# Patient Record
Sex: Female | Born: 1935 | Race: White | Hispanic: No | Marital: Single | Smoking: Never smoker
Health system: Southern US, Community
[De-identification: ages and names within clinical notes are randomized; demographics above are authoritative.]

## PROBLEM LIST (undated history)

## (undated) DIAGNOSIS — F0390 Unspecified dementia without behavioral disturbance: Secondary | ICD-10-CM

## (undated) DIAGNOSIS — E079 Disorder of thyroid, unspecified: Secondary | ICD-10-CM

## (undated) DIAGNOSIS — I10 Essential (primary) hypertension: Secondary | ICD-10-CM

## (undated) HISTORY — PX: HERNIA REPAIR: SHX51

---

## 2011-05-22 ENCOUNTER — Emergency Department (HOSPITAL_COMMUNITY): Payer: Medicare Other

## 2011-05-22 ENCOUNTER — Emergency Department (HOSPITAL_COMMUNITY)
Admission: EM | Admit: 2011-05-22 | Discharge: 2011-05-22 | Disposition: A | Discharge status: Home / Self Care | Payer: Medicare Other | Attending: Emergency Medicine | Admitting: Emergency Medicine

## 2011-05-22 ENCOUNTER — Encounter (HOSPITAL_COMMUNITY): Payer: Self-pay | Admitting: Cardiology

## 2011-05-22 DIAGNOSIS — Z79899 Other long term (current) drug therapy: Secondary | ICD-10-CM | POA: Insufficient documentation

## 2011-05-22 DIAGNOSIS — R079 Chest pain, unspecified: Secondary | ICD-10-CM | POA: Insufficient documentation

## 2011-05-22 DIAGNOSIS — E039 Hypothyroidism, unspecified: Secondary | ICD-10-CM | POA: Insufficient documentation

## 2011-05-22 DIAGNOSIS — I10 Essential (primary) hypertension: Secondary | ICD-10-CM | POA: Insufficient documentation

## 2011-05-22 DIAGNOSIS — F068 Other specified mental disorders due to known physiological condition: Secondary | ICD-10-CM | POA: Insufficient documentation

## 2011-05-22 DIAGNOSIS — K5900 Constipation, unspecified: Secondary | ICD-10-CM | POA: Insufficient documentation

## 2011-05-22 HISTORY — DX: Essential (primary) hypertension: I10

## 2011-05-22 HISTORY — DX: Disorder of thyroid, unspecified: E07.9

## 2011-05-22 HISTORY — DX: Unspecified dementia, unspecified severity, without behavioral disturbance, psychotic disturbance, mood disturbance, and anxiety: F03.90

## 2011-05-22 LAB — URINALYSIS, ROUTINE W REFLEX MICROSCOPIC
Bilirubin Urine: NEGATIVE
Glucose, UA: NEGATIVE mg/dL
Hgb urine dipstick: NEGATIVE
Ketones, ur: NEGATIVE mg/dL
Protein, ur: NEGATIVE mg/dL
pH: 6.5 (ref 5.0–8.0)

## 2011-05-22 LAB — COMPREHENSIVE METABOLIC PANEL
ALT: 12 U/L (ref 0–35)
AST: 17 U/L (ref 0–37)
Albumin: 3.6 g/dL (ref 3.5–5.2)
Alkaline Phosphatase: 84 U/L (ref 39–117)
CO2: 26 mEq/L (ref 19–32)
Chloride: 101 mEq/L (ref 96–112)
GFR calc non Af Amer: 31 mL/min — ABNORMAL LOW (ref >90)
Potassium: 3.7 mEq/L (ref 3.5–5.1)
Total Bilirubin: 0.3 mg/dL (ref 0.3–1.2)

## 2011-05-22 LAB — URINE MICROSCOPIC-ADD ON

## 2011-05-22 LAB — DIFFERENTIAL
Basophils Absolute: 0 10*3/uL (ref 0.0–0.1)
Basophils Relative: 0 % (ref 0–1)
Lymphocytes Relative: 36 % (ref 12–46)
Monocytes Absolute: 0.8 10*3/uL (ref 0.1–1.0)
Monocytes Relative: 9 % (ref 3–12)
Neutro Abs: 4.3 10*3/uL (ref 1.7–7.7)
Neutrophils Relative %: 50 % (ref 43–77)

## 2011-05-22 LAB — CBC
HCT: 39.4 % (ref 36.0–46.0)
Hemoglobin: 13.5 g/dL (ref 12.0–15.0)
MCHC: 34.3 g/dL (ref 30.0–36.0)
RDW: 13.3 % (ref 11.5–15.5)
WBC: 8.6 10*3/uL (ref 4.0–10.5)

## 2011-05-22 NOTE — ED Notes (Signed)
Pt to department via EMS from [facility]- reports that when she woke up this morning that she had some lower back pain that turned into epigastric pain. Staff there reports that they wanted the pt checked out for cardiac work up. Bp- 124/78 Hr-60.

## 2011-05-22 NOTE — Discharge Instructions (Signed)
Increase the prilosec to two times a day.  Have her provider check her this week.

## 2011-05-22 NOTE — ED Notes (Signed)
Report was called to [facility], waiting for [facility] to transport patient back to the [facility]

## 2011-05-24 NOTE — ED Provider Notes (Cosign Needed)
History     CSN: [id number]  Arrival date & time 05/22/11  1246   First MD Initiated Contact with Patient 05/22/11 1305      Chief Complaint  Patient presents with  . Abdominal Pain    (Consider location/radiation/quality/duration/timing/severity/associated sxs/prior treatment) Patient is a 78 y.o. female presenting with constipation. The history is provided by the patient. No language interpreter was used.  Constipation  The current episode started yesterday. The problem occurs frequently. The problem has been unchanged. The pain is mild. The stool is described as hard. There was no prior successful therapy. There was no prior unsuccessful therapy. Pertinent negatives include no abdominal pain, no diarrhea, no hematuria, no headaches, no coughing and no rash.    Past Medical History  Diagnosis Date  . Hypertension   . Dementia   . Thyroid disease     Hypothyroidism  . Constipation     Past Surgical History  Procedure Date  . Hernia repair     History reviewed. No pertinent family history.  History  Substance Use Topics  . Smoking status: Not on file  . Smokeless tobacco: Not on file  . Alcohol Use:     OB History    Grav Para Term Preterm Abortions TAB SAB Ect Mult Living                  Review of Systems  Constitutional: Negative for fatigue.  HENT: Negative for congestion, sinus pressure and ear discharge.   Eyes: Negative for discharge.  Respiratory: Negative for cough.   Cardiovascular: Negative for leg swelling.  Gastrointestinal: Positive for constipation. Negative for abdominal pain and diarrhea.  Genitourinary: Negative for frequency and hematuria.  Musculoskeletal: Negative for back pain.  Skin: Negative for rash.  Neurological: Negative for seizures and headaches.  Hematological: Negative.   Psychiatric/Behavioral: Negative for hallucinations.    Allergies  Ace inhibitors  Home Medications   Current Outpatient Rx  Name Route Sig  Dispense Refill  . ACETAMINOPHEN 325 MG PO TABS Oral Take 650 mg by mouth 3 (three) times daily as needed. For pain    . ALBUTEROL SULFATE HFA 108 (90 BASE) MCG/ACT IN AERS Inhalation Inhale 2 puffs into the lungs every 4 (four) hours as needed. For shortness of breath/wheezing    . ATORVASTATIN CALCIUM 20 MG PO TABS Oral Take 20 mg by mouth daily.    Marland Kitchen CALCIUM CARBONATE-VITAMIN D 600-400 MG-UNIT PO TABS Oral Take 1 tablet by mouth daily.    Marland Kitchen VITAMIN D 1000 UNITS PO TABS Oral Take 1,000 Units by mouth daily.    Marland Kitchen CITALOPRAM HYDROBROMIDE 10 MG PO TABS Oral Take 10 mg by mouth daily.    Marland Kitchen DOCUSATE SODIUM 100 MG PO CAPS Oral Take 100 mg by mouth 2 (two) times daily.    Marland Kitchen GABAPENTIN 100 MG PO CAPS Oral Take 200 mg by mouth 2 (two) times daily.    Marland Kitchen HYDRALAZINE HCL 25 MG PO TABS Oral Take 25 mg by mouth 4 (four) times daily.    Marland Kitchen HYDROCHLOROTHIAZIDE 25 MG PO TABS Oral Take 25 mg by mouth daily.    Marland Kitchen LEVOTHYROXINE SODIUM 75 MCG PO TABS Oral Take 75 mcg by mouth daily.    Marland Kitchen MEMANTINE HCL 5 MG PO TABS Oral Take 5 mg by mouth 2 (two) times daily.    Marland Kitchen OMEPRAZOLE 20 MG PO CPDR Oral Take 20 mg by mouth daily.    Marland Kitchen RIVASTIGMINE 4.6 MG/24HR TD PT24 Transdermal Place 1  patch onto the skin daily.      BP 157/88  Pulse 55  Temp(Src) 98.7 F (37.1 C) (Oral)  Resp 16  SpO2 97%  Physical Exam  Constitutional: She is oriented to person, place, and time. She appears well-developed.  HENT:  Head: Normocephalic and atraumatic.  Eyes: Conjunctivae and EOM are normal. No scleral icterus.  Neck: Neck supple. No thyromegaly present.  Cardiovascular: Normal rate and regular rhythm.  Exam reveals no gallop and no friction rub.   No murmur heard. Pulmonary/Chest: No stridor. She has no wheezes. She has no rales. She exhibits no tenderness.  Abdominal: She exhibits no distension. There is no tenderness. There is no rebound.  Musculoskeletal: Normal range of motion. She exhibits no edema.  Lymphadenopathy:     She has no cervical adenopathy.  Neurological: She is oriented to person, place, and time. Coordination normal.  Skin: No rash noted. No erythema.  Psychiatric: She has a normal mood and affect. Her behavior is normal.    ED Course  Procedures (including critical care time)  Labs Reviewed  COMPREHENSIVE METABOLIC PANEL - Abnormal; Notable for the following:    BUN 37 (*)    Creatinine, Ser 1.46 (*)    GFR calc non Af Amer 31 (*)    GFR calc Af Amer 36 (*)    All other components within normal limits  URINALYSIS, ROUTINE W REFLEX MICROSCOPIC - Abnormal; Notable for the following:    Leukocytes, UA LARGE (*)    All other components within normal limits  CBC  DIFFERENTIAL  LIPASE, BLOOD  TROPONIN I  URINE MICROSCOPIC-ADD ON  LAB REPORT - SCANNED   Ct Abdomen Pelvis Wo Contrast  05/22/2011  *RADIOLOGY REPORT*  Clinical Data: Epigastric pain, rule out kidney stone  CT ABDOMEN AND PELVIS WITHOUT CONTRAST  Technique:  Multidetector CT imaging of the abdomen and pelvis was performed following the standard protocol without intravenous contrast.  Comparison: None.  Findings: Borderline cardiomegaly.  Small pericardial effusion. Bilateral small pleural effusion with bilateral basilar posterior atelectasis.  Moderate sized hiatal hernia.  Sagittal images of the spine shows no destructive bony lesions.  There is a calcified gallstone in the gallbladder neck region measures 9 x 5.5 mm.  No pericholecystic fluid.  Atherosclerotic calcifications of the abdominal aorta and the iliac arteries.  No aortic aneurysm.  Unenhanced liver is unremarkable.  Unenhanced pancreas, spleen and adrenal glands are unremarkable.  Unenhanced kidneys are symmetrical in size.  No nephrolithiasis. No hydronephrosis or hydroureter.  No calcified ureteral calculi are noted.  No small bowel obstruction.  No ascites or free air.  No adenopathy.  Normal appendix is clearly visualized axial image 45. No pericecal inflammation.   Multiple sigmoid colon diverticula are noted without evidence of acute diverticulitis.  Moderate stool noted within rectum which measures 5.5 cm in diameter.  The urinary bladder is unremarkable.  The uterus is atrophic.  Scattered diverticula are noted right colon and transverse colon without evidence of acute diverticulitis.  IMPRESSION:  1.  No nephrolithiasis.  No hydronephrosis or hydroureter.  No calcified ureteral calculi are noted. 2.  Colonic diverticula are noted without evidence of acute diverticulitis. 3.  Normal appendix.  No pericecal inflammation. 4.  Moderate stool noted within rectum. 5.  Moderate sized hiatal hernia.  6.   Calcified gallstone in gallbladder neck region is 9 x 5.5 mm.  Original Report Authenticated By: Natasha Mead, M.D.     1. Constipation   2. Chest pain  MDM          Benny Lennert, MD 05/24/11 [phone]

## 2011-07-24 ENCOUNTER — Emergency Department (HOSPITAL_COMMUNITY): Payer: Medicare Other

## 2011-07-24 ENCOUNTER — Encounter (HOSPITAL_COMMUNITY): Payer: Self-pay | Admitting: *Deleted

## 2011-07-24 ENCOUNTER — Observation Stay (HOSPITAL_COMMUNITY)
Admission: EM | Admit: 2011-07-24 | Discharge: 2011-07-25 | Payer: Medicare Other | Source: Ambulatory Visit | Attending: Family Medicine | Admitting: Family Medicine

## 2011-07-24 DIAGNOSIS — S0003XA Contusion of scalp, initial encounter: Secondary | ICD-10-CM | POA: Insufficient documentation

## 2011-07-24 DIAGNOSIS — H1130 Conjunctival hemorrhage, unspecified eye: Secondary | ICD-10-CM | POA: Insufficient documentation

## 2011-07-24 DIAGNOSIS — I10 Essential (primary) hypertension: Secondary | ICD-10-CM | POA: Diagnosis present

## 2011-07-24 DIAGNOSIS — N183 Chronic kidney disease, stage 3 unspecified: Secondary | ICD-10-CM | POA: Insufficient documentation

## 2011-07-24 DIAGNOSIS — Z9181 History of falling: Secondary | ICD-10-CM | POA: Insufficient documentation

## 2011-07-24 DIAGNOSIS — W19XXXA Unspecified fall, initial encounter: Secondary | ICD-10-CM

## 2011-07-24 DIAGNOSIS — S0010XA Contusion of unspecified eyelid and periocular area, initial encounter: Secondary | ICD-10-CM | POA: Insufficient documentation

## 2011-07-24 DIAGNOSIS — I129 Hypertensive chronic kidney disease with stage 1 through stage 4 chronic kidney disease, or unspecified chronic kidney disease: Secondary | ICD-10-CM | POA: Insufficient documentation

## 2011-07-24 DIAGNOSIS — R059 Cough, unspecified: Secondary | ICD-10-CM | POA: Insufficient documentation

## 2011-07-24 DIAGNOSIS — R55 Syncope and collapse: Principal | ICD-10-CM | POA: Insufficient documentation

## 2011-07-24 DIAGNOSIS — S0083XA Contusion of other part of head, initial encounter: Secondary | ICD-10-CM | POA: Insufficient documentation

## 2011-07-24 DIAGNOSIS — E039 Hypothyroidism, unspecified: Secondary | ICD-10-CM | POA: Insufficient documentation

## 2011-07-24 DIAGNOSIS — E785 Hyperlipidemia, unspecified: Secondary | ICD-10-CM | POA: Insufficient documentation

## 2011-07-24 DIAGNOSIS — R05 Cough: Secondary | ICD-10-CM | POA: Insufficient documentation

## 2011-07-24 DIAGNOSIS — E871 Hypo-osmolality and hyponatremia: Secondary | ICD-10-CM | POA: Insufficient documentation

## 2011-07-24 DIAGNOSIS — F0390 Unspecified dementia without behavioral disturbance: Secondary | ICD-10-CM | POA: Diagnosis present

## 2011-07-24 LAB — URINALYSIS, ROUTINE W REFLEX MICROSCOPIC
Bilirubin Urine: NEGATIVE
Nitrite: NEGATIVE
Protein, ur: NEGATIVE mg/dL
Specific Gravity, Urine: 1.014 (ref 1.005–1.030)
Urobilinogen, UA: 0.2 mg/dL (ref 0.0–1.0)

## 2011-07-24 LAB — CBC
MCH: 31 pg (ref 26.0–34.0)
MCHC: 34.4 g/dL (ref 30.0–36.0)
MCHC: 34.2 g/dL (ref 30.0–36.0)
MCV: 90.7 fL (ref 78.0–100.0)
Platelets: 203 10*3/uL (ref 150–400)
Platelets: 180 10*3/uL (ref 150–400)
RDW: 13 % (ref 11.5–15.5)
RDW: 12.9 % (ref 11.5–15.5)
WBC: 9.6 10*3/uL (ref 4.0–10.5)

## 2011-07-24 LAB — URINE MICROSCOPIC-ADD ON

## 2011-07-24 LAB — BASIC METABOLIC PANEL
Chloride: 98 mEq/L (ref 96–112)
GFR calc Af Amer: 37 mL/min — ABNORMAL LOW (ref >90)
GFR calc non Af Amer: 32 mL/min — ABNORMAL LOW (ref >90)
Potassium: 4.1 mEq/L (ref 3.5–5.1)

## 2011-07-24 LAB — TROPONIN I: Troponin I: <0.3 ng/mL (ref <0.30)

## 2011-07-24 MED ORDER — VITAMIN D3 25 MCG (1000 UNIT) PO TABS
1000.0000 [IU] | ORAL_TABLET | Freq: Every day | ORAL | Status: DC
  Administered 2011-07-25: 1000 [IU] via ORAL
  Filled 2011-07-24: qty 1

## 2011-07-24 MED ORDER — VITAMIN D3 25 MCG (1000 UNIT) PO TABS
1000.0000 [IU] | ORAL_TABLET | Freq: Every day | ORAL | Status: DC
  Filled 2011-07-24: qty 1

## 2011-07-24 MED ORDER — MEMANTINE HCL 5 MG PO TABS
5.0000 mg | ORAL_TABLET | Freq: Two times a day (BID) | ORAL | Status: DC
  Administered 2011-07-25 (×2): 5 mg via ORAL
  Filled 2011-07-24 (×4): qty 1

## 2011-07-24 MED ORDER — ACETAMINOPHEN 325 MG PO TABS: 650.0000 mg | ORAL_TABLET | Freq: Four times a day (QID) | ORAL | Status: DC | PRN

## 2011-07-24 MED ORDER — SODIUM CHLORIDE 0.9 % IV SOLN
INTRAVENOUS | Status: AC
  Administered 2011-07-25 (×2): via INTRAVENOUS

## 2011-07-24 MED ORDER — GABAPENTIN 100 MG PO CAPS
200.0000 mg | ORAL_CAPSULE | Freq: Two times a day (BID) | ORAL | Status: DC
  Administered 2011-07-25 (×2): 200 mg via ORAL
  Filled 2011-07-24 (×3): qty 2

## 2011-07-24 MED ORDER — FLUTICASONE FUROATE 27.5 MCG/SPRAY NA SUSP: 1.0000 | Freq: Every day | NASAL | Status: DC

## 2011-07-24 MED ORDER — ALBUTEROL SULFATE HFA 108 (90 BASE) MCG/ACT IN AERS: 2.0000 | INHALATION_SPRAY | RESPIRATORY_TRACT | Status: DC | PRN

## 2011-07-24 MED ORDER — LORATADINE 10 MG PO TABS
10.0000 mg | ORAL_TABLET | Freq: Every day | ORAL | Status: DC
  Administered 2011-07-25: 10 mg via ORAL
  Filled 2011-07-24: qty 1

## 2011-07-24 MED ORDER — DOCUSATE SODIUM 100 MG PO CAPS
100.0000 mg | ORAL_CAPSULE | Freq: Two times a day (BID) | ORAL | Status: DC
  Administered 2011-07-25 (×2): 100 mg via ORAL
  Filled 2011-07-24 (×3): qty 1

## 2011-07-24 MED ORDER — SODIUM CHLORIDE 0.9 % IJ SOLN
3.0000 mL | Freq: Two times a day (BID) | INTRAMUSCULAR | Status: DC
  Administered 2011-07-25 (×2): 3 mL via INTRAVENOUS

## 2011-07-24 MED ORDER — ATORVASTATIN CALCIUM 20 MG PO TABS
20.0000 mg | ORAL_TABLET | Freq: Every day | ORAL | Status: DC
  Administered 2011-07-25: 20 mg via ORAL
  Filled 2011-07-24 (×2): qty 1

## 2011-07-24 MED ORDER — FLUTICASONE PROPIONATE 50 MCG/ACT NA SUSP
1.0000 | Freq: Every day | NASAL | Status: DC
  Administered 2011-07-25: 1 via NASAL
  Filled 2011-07-24: qty 16

## 2011-07-24 MED ORDER — HEPARIN SODIUM (PORCINE) 5000 UNIT/ML IJ SOLN
5000.0000 [IU] | Freq: Three times a day (TID) | INTRAMUSCULAR | Status: DC
  Administered 2011-07-25 (×2): 5000 [IU] via SUBCUTANEOUS
  Filled 2011-07-24 (×5): qty 1

## 2011-07-24 MED ORDER — CALCIUM CARBONATE-VITAMIN D 600-400 MG-UNIT PO TABS: 1.0000 | ORAL_TABLET | Freq: Every day | ORAL | Status: DC

## 2011-07-24 MED ORDER — LEVOTHYROXINE SODIUM 75 MCG PO TABS
75.0000 ug | ORAL_TABLET | Freq: Every day | ORAL | Status: DC
  Administered 2011-07-25: 75 ug via ORAL
  Filled 2011-07-24 (×2): qty 1

## 2011-07-24 MED ORDER — CITALOPRAM HYDROBROMIDE 10 MG PO TABS
10.0000 mg | ORAL_TABLET | Freq: Every day | ORAL | Status: DC
  Administered 2011-07-25: 10 mg via ORAL
  Filled 2011-07-24: qty 1

## 2011-07-24 MED ORDER — CALCIUM CARBONATE-VITAMIN D 500-200 MG-UNIT PO TABS
1.0000 | ORAL_TABLET | Freq: Every day | ORAL | Status: DC
  Administered 2011-07-25 (×2): 1 via ORAL
  Filled 2011-07-24 (×2): qty 1

## 2011-07-24 MED ORDER — ACETAMINOPHEN 650 MG RE SUPP: 650.0000 mg | Freq: Four times a day (QID) | RECTAL | Status: DC | PRN

## 2011-07-24 MED ORDER — PANTOPRAZOLE SODIUM 20 MG PO TBEC
20.0000 mg | DELAYED_RELEASE_TABLET | Freq: Every day | ORAL | Status: DC
  Administered 2011-07-25: 20 mg via ORAL
  Filled 2011-07-24: qty 1

## 2011-07-24 MED ORDER — HYDROCHLOROTHIAZIDE 25 MG PO TABS
25.0000 mg | ORAL_TABLET | Freq: Every day | ORAL | Status: DC
  Administered 2011-07-25: 25 mg via ORAL
  Filled 2011-07-24: qty 1

## 2011-07-24 MED ORDER — RIVASTIGMINE 4.6 MG/24HR TD PT24
4.6000 mg | MEDICATED_PATCH | Freq: Every day | TRANSDERMAL | Status: DC
  Administered 2011-07-25: 4.6 mg via TRANSDERMAL
  Filled 2011-07-24: qty 1

## 2011-07-24 NOTE — ED Notes (Addendum)
Patient returned from xray and CT.  Patient remains on monitor and stas of 94%RA. Family at bedside.

## 2011-07-24 NOTE — H&P (Signed)
Family Medicine Teaching [facility] Admission History and Physical  Patient name: Tamara Gonzales Medical record number: [id number] Date of birth: 1919-04-18 Age: 78 y.o. Gender: female  Primary Care Provider: Florentina Jenny, MD  Chief Complaint: syncope History of Present Illness: Tamara Gonzales is a 78 y.o. year old female with hypertension and dementia presenting with a syncopal episode this morning.  Patient reports sitting on the edge of her bed about to get up feeling well.  The next thing she remembers is 3 men coming into the room to help her up.  Denies any confusion on awakening.  No chest pain, palpitations, fevers, n/v/d, diaphoresis prior to event.  Has had several falls in the past.  One a week ago after working in a garden in the heat and another 1 year ago (worked up by Dr. Cherre Gonzales at [facility] neurology).  One week ago, her blood pressure meds were changed - she was started on lisinopril and one of her other medications was changed as well (don't know which one).  She did develop a cough 3 days ago; no associated fever.  ED Course: Imaging of head and neck negative for acute fractures or bleeds.  Electrolytes normal.  Troponin negative.  EKG unchanged.    Patient Active Problem List  Diagnosis  . Syncope  . Hypertension  . Dementia   Past Medical History: Past Medical History  Diagnosis Date  . Hypertension   . Dementia   . Thyroid disease     Hypothyroidism  . Constipation     Past Surgical History: Past Surgical History  Procedure Date  . Hernia repair     Social History: No tobacco, alcohol or drug history History   Social History  . Marital Status: Single    Spouse Name: N/A    Number of Children: N/A  . Years of Education: N/A   Social History Main Topics  . Smoking status: None  . Smokeless tobacco: None  . Alcohol Use:   . Drug Use:   . Sexually Active:    Other Topics Concern  . None   Social History Narrative  . None    Family  History: History reviewed. No pertinent family history.  Allergies: Allergies  Allergen Reactions  . Ace Inhibitors Other (See Comments)    Noted on MAR    No current facility-administered medications for this encounter.   Current Outpatient Prescriptions  Medication Sig Dispense Refill  . albuterol (PROVENTIL HFA;VENTOLIN HFA) 108 (90 BASE) MCG/ACT inhaler Inhale 2 puffs into the lungs every 4 (four) hours as needed. For shortness of breath      . atorvastatin (LIPITOR) 20 MG tablet Take 20 mg by mouth daily.      . Calcium Carbonate-Vitamin D (CALCIUM 600+D) 600-400 MG-UNIT per tablet Take 1 tablet by mouth daily.      . cholecalciferol (VITAMIN D) 1000 UNITS tablet Take 1,000 Units by mouth daily.      . citalopram (CELEXA) 10 MG tablet Take 10 mg by mouth daily.      Marland Kitchen docusate sodium (COLACE) 100 MG capsule Take 100 mg by mouth 2 (two) times daily.      . fluticasone (VERAMYST) 27.5 MCG/SPRAY nasal spray Place 1 spray into the nose daily.      Marland Kitchen gabapentin (NEURONTIN) 100 MG capsule Take 200 mg by mouth 2 (two) times daily.      Marland Kitchen guaifenesin (ROBITUSSIN) 100 MG/5ML syrup Take 300 mg by mouth every 4 (four) hours as needed. For cough      .  hydrALAZINE (APRESOLINE) 25 MG tablet Take 25 mg by mouth 3 (three) times daily.       . hydrochlorothiazide (HYDRODIURIL) 25 MG tablet Take 25 mg by mouth daily.      Marland Kitchen levothyroxine (SYNTHROID, LEVOTHROID) 75 MCG tablet Take 75 mcg by mouth daily.      Marland Kitchen lisinopril (PRINIVIL,ZESTRIL) 5 MG tablet Take 5 mg by mouth at bedtime.      Marland Kitchen loratadine (CLARITIN) 10 MG tablet Take 10 mg by mouth daily.      . memantine (NAMENDA) 5 MG tablet Take 5 mg by mouth 2 (two) times daily.      Marland Kitchen omeprazole (PRILOSEC) 20 MG capsule Take 20 mg by mouth daily.      . pantoprazole (PROTONIX) 20 MG tablet Take 20 mg by mouth daily.      . rivastigmine (EXELON) 4.6 mg/24hr Place 1 patch onto the skin daily.      Marland Kitchen albuterol (PROVENTIL HFA;VENTOLIN HFA) 108 (90  BASE) MCG/ACT inhaler Inhale 2 puffs into the lungs every 4 (four) hours as needed. For shortness of breath/wheezing       Review Of Systems: Per HPI with the following additions: none Otherwise 12 point review of systems was performed and was unremarkable.  Physical Exam: Pulse: 60  Blood Pressure: 167/70 RR: 17   O2: 97 on RA Temp: 97.9  General: alert, cooperative, appears stated age and no distress HEENT: PERRLA, extra ocular movement intact, sclera clear, anicteric, oropharynx clear, no lesions and right black eye Heart: 2/6 SEM is heard at upper right sternal border, S1 and S2 normal, regular rate and rhythm Lungs: clear to auscultation, no wheezes or rales, unlabored breathing and mild bibasilar crackles Abdomen: abdomen is soft without significant tenderness, masses, organomegaly or guarding Extremities: extremities normal, atraumatic, no cyanosis or edema Skin:no rashes, right black eye Neurology: normal without focal findings, mental status, speech normal, alert and oriented x3, PERLA and cranial nerves 2-12 intact  Labs and Imaging:  Lab 07/24/11 1604  WBC 9.6  HGB 13.5  HCT 39.2  PLT 203    Lab 07/24/11 1604  NA 134*  K 4.1  CL 98  CO2 24  BUN 40*  CREATININE 1.42*  LABGLOM --  GLUCOSE 96  CALCIUM 9.7    Lab 07/24/11 1604  CKTOTAL --  TROPONINI <0.30  TROPONINT --  CKMBINDEX --   UA: trace LE, otherwise unremarkable  CXR Enlargement of cardiac silhouette.  Bibasilar atelectasis with question area consolidation at the  medial left lung base.  CT Head 1. No acute finding.  2. Extensive chronic microvascular ischemic change.  3. Mild appearing sinus disease.   CT Maxillofacial 1. Soft tissue contusion about the right eye. Small amount of  intraorbital hematoma is identified.  2. No facial bone fracture.  3. Scattered, mild appearing sinus disease.  CT Neck No acute finding. Multilevel degenerative change.  Assessment and Plan: Tamara Gonzales  is a 78 y.o. year old female with hypertension and dementia presenting with a syncopal episode.  # Syncope: Likely orthostatic hypotension.  Differential also includes cardiac arrhythmia, ACS, infection (pneumonia, UTI), seizure, electrolyte disturbances, stroke.  However, given history and lab/ekg findings these seem unlikely.  Did have what sounds like carotid dopplers a year ago that showed stenosis; however, no evidence for a stroke and this is unlikely to cause acute syncope. - will check orthostatic vitals - will monitor of telemetry overnight - PT/OT to evaluate - will hold home lisinopril and hydralazine; continue  HCTZ  # Cough: Likely related to starting lisinopril given time course.  However, given that her chest x-ray showed area of questionable consolidation will monitor. - IS - hold home lisinopril - recheck CXR in am  # Mild hyponatremia: Likely secondary to mild dehydration. - will gently hydrate with NS 150cc/hr overnight given enlarged heart on CXR - recheck in the morning  # Hypertension: Mildly elevated in the ED to the 160s systolic. - will hold home lisinopril and hydralazine given syncopal episode  # CKD stage III: Creatinine at baseline. - will monitor  # Hyperlipidemia: Stable.  Continue home meds  # Hypothyroidism - will check TSH - continue home medicine  # Dementia: Stable per family. - continue home medications  FEN/GI: heart healthy diet; NS at 150 cc/hr x12 hours Prophylaxis: SQ heparin Disposition: admit to telemetry for observation  BOOTH, ERIN 07/24/2011, 7:30 PM  R3 addendum  Patient seen and examined at the same time as Dr. Elwyn Reach in the room. I agree with the above H+P.  Independent PE below: alert, cooperative, appears stated age and no distress HEENT: PERRLA, extra ocular movement intact, sclera clear, anicteric, oropharynx clear, no lesions and right black eye Heart: 2/6 SEM is heard at upper right sternal border, S1 and S2 normal,  regular rate and rhythm Lungs: clear to auscultation, no wheezes or rales, unlabored breathing and mild bibasilar crackles Abdomen: abdomen is soft without significant tenderness, masses, organomegaly or guarding Extremities: extremities normal, atraumatic, no cyanosis or edema Skin:no rashes, right black eye Neurology: normal without focal findings, mental status, speech normal, alert and oriented x3, PERLA and cranial nerves 2-12 intact  Edd Arbour, MD

## 2011-07-24 NOTE — ED Notes (Signed)
Patient transported to CT/XRAY 

## 2011-07-24 NOTE — ED Notes (Signed)
REPORT CALLED TO RYAN RN ON [id number]. DENIES FURTHER QUESTIONS AT THIS TIME. PT IN NAD AT TIME OF TRANSPORT

## 2011-07-24 NOTE — ED Provider Notes (Signed)
Patient with a hx sig for baseline dementia presented to the emergency department with an unwitnessed fall and right.  Orbital/frontal contusions.  CT maxillofacial shows no fractures (radiologist mentions small intraorbital hemorrhage, however in reviewing with Dr. Manus Gunning this is not seen evident  ).small conjunctival hematoma present.  Chest x-ray shows possible new consolidation which is not clinically relevant because patient reports no cough with vital signs stable.  Mechanism of fall is unknown and patient states that she remembers everything up to the event but did not even remember the event at all.  Likely etiology is syncope.  Plan per previous provider Artis Flock, [facility]) is to have patient admitted for syncope workup.  Note that per patient's family patient recently had an evaluation done at [facility]?  Carotid Doppler indicating blockage. Patient re-evaluated and is resting comfortable, VSS, with no new complaints or concerns at this time.  On exam: hemodynamically stable, NAD, heart w/ RRR, lungs CTAB, Chest & abd non-tender, no peripheral edema or calf tenderness. Will admit once labs have resulted  5:21 PM Results for orders placed during the hospital encounter of 07/24/11  CBC      Component Value Range   WBC 9.6  4.0 - 10.5 K/uL   RBC 4.29  3.87 - 5.11 MIL/uL   Hemoglobin 13.5  12.0 - 15.0 g/dL   HCT 16.1  09.6 - 04.5 %   MCV 91.4  78.0 - 100.0 fL   MCH 31.5  26.0 - 34.0 pg   MCHC 34.4  30.0 - 36.0 g/dL   RDW 40.9  81.1 - 91.4 %   Platelets 203  150 - 400 K/uL  BASIC METABOLIC PANEL      Component Value Range   Sodium 134 (*) 135 - 145 mEq/L   Potassium 4.1  3.5 - 5.1 mEq/L   Chloride 98  96 - 112 mEq/L   CO2 24  19 - 32 mEq/L   Glucose, Bld 96  70 - 99 mg/dL   BUN 40 (*) 6 - 23 mg/dL   Creatinine, Ser 7.82 (*) 0.50 - 1.10 mg/dL   Calcium 9.7  8.4 - 95.6 mg/dL   GFR calc non Af Amer 32 (*) >90 mL/min   GFR calc Af Amer 37 (*) >90 mL/min  TROPONIN I      Component Value Range   Troponin I <0.30  <0.30 ng/mL   6:37 PM  Pt to be admitted to family medicine for possible syncopal wk up s/p un witnessed fall. The patient appears reasonably stabilized for admission considering the current resources, flow, and capabilities available in the ED at this time, and I doubt any other [facility] requiring further screening and/or treatment in the ED prior to admission.      Jaci Carrel, [place] 07/24/11 2130

## 2011-07-24 NOTE — ED Notes (Signed)
Patient transported to CT 

## 2011-07-24 NOTE — ED Notes (Signed)
The patient has been given a drink of orange juice. We are waiting for a urine sample. The patient's family stated that she has not had any thing to drink today so she is unable to go.

## 2011-07-24 NOTE — ED Notes (Signed)
Per EMS - patient transported from [facility]. Staff told EMS patient did not go to breakfast or lunch at 1130 and was checked on at 1230 and was found in bed and had fell. Patient does not remember falling but staff states she falls often. When EMS stood patient up she c/o dizziness. Patient did not want to come to hospital but facility states patients family wanted patient taken to hospital due to previous head trama. BP 130/58 HR 70 Resp 18 Sats 95% RA.

## 2011-07-24 NOTE — ED Notes (Signed)
Patient transported to X-ray 

## 2011-07-24 NOTE — ED Provider Notes (Signed)
History     CSN: [id number]  Arrival date & time 07/24/11  1315   First MD Initiated Contact with Patient 07/24/11 1321      Chief Complaint  Patient presents with  . Fall    (Consider location/radiation/quality/duration/timing/severity/associated sxs/prior treatment) Patient is a 78 y.o. female presenting with fall. The history is provided by the patient and the nursing home.  Fall Incident onset: unknown. Associated symptoms include headaches. Pertinent negatives include no fever, no numbness, no abdominal pain, no nausea and no vomiting.  Pt has a hx dementia, severity unknown.  NH report per EMS indicates pt was found in bed at 12:30 PM today (after not being seen for breakfast or lunch) with hematoma to the right eye/forehead. She apparently has frequent falls and told staff she did not recall the fall today. Did have some dizziness when standing with EMS. Pt relates to me that she wasn't hungry for breakfast but must have fallen sometime before lunch. Does not remember what made her fall or what she hit in the fall. Unknown LOC. C/o mild HA, right eye pain. Denies neck pain or any other injuries. No aggravating or alleviating factors. No treatment PTA.  Past Medical History  Diagnosis Date  . Hypertension   . Dementia   . Thyroid disease     Hypothyroidism  . Constipation     Past Surgical History  Procedure Date  . Hernia repair     History reviewed. No pertinent family history.  History  Substance Use Topics  . Smoking status: Not on file  . Smokeless tobacco: Not on file  . Alcohol Use:     Review of Systems  Constitutional: Negative for fever and chills.  HENT: Negative for ear pain, nosebleeds and neck pain.   Eyes: Positive for pain. Negative for visual disturbance.  Respiratory: Positive for cough. Negative for shortness of breath.   Cardiovascular: Negative for chest pain.  Gastrointestinal: Negative for nausea, vomiting and abdominal pain.    Musculoskeletal: Negative for back pain.  Skin: Positive for color change.  Neurological: Positive for dizziness and headaches. Negative for weakness and numbness. Syncope: unknown.  Hematological: Does not bruise/bleed easily (denies anticoagulant use).    Allergies  Ace inhibitors  Home Medications   Current Outpatient Rx  Name Route Sig Dispense Refill  . ALBUTEROL SULFATE HFA 108 (90 BASE) MCG/ACT IN AERS Inhalation Inhale 2 puffs into the lungs every 4 (four) hours as needed. For shortness of breath    . ATORVASTATIN CALCIUM 20 MG PO TABS Oral Take 20 mg by mouth daily.    Marland Kitchen CALCIUM CARBONATE-VITAMIN D 600-400 MG-UNIT PO TABS Oral Take 1 tablet by mouth daily.    Marland Kitchen VITAMIN D 1000 UNITS PO TABS Oral Take 1,000 Units by mouth daily.    Marland Kitchen CITALOPRAM HYDROBROMIDE 10 MG PO TABS Oral Take 10 mg by mouth daily.    Marland Kitchen DOCUSATE SODIUM 100 MG PO CAPS Oral Take 100 mg by mouth 2 (two) times daily.    Marland Kitchen FLUTICASONE FUROATE 27.5 MCG/SPRAY NA SUSP Nasal Place 1 spray into the nose daily.    Marland Kitchen GABAPENTIN 100 MG PO CAPS Oral Take 200 mg by mouth 2 (two) times daily.    . GUAIFENESIN 100 MG/5ML PO SYRP Oral Take 300 mg by mouth every 4 (four) hours as needed. For cough    . HYDRALAZINE HCL 25 MG PO TABS Oral Take 25 mg by mouth 3 (three) times daily.     Marland Kitchen HYDROCHLOROTHIAZIDE  25 MG PO TABS Oral Take 25 mg by mouth daily.    Marland Kitchen LEVOTHYROXINE SODIUM 75 MCG PO TABS Oral Take 75 mcg by mouth daily.    Marland Kitchen LISINOPRIL 5 MG PO TABS Oral Take 5 mg by mouth at bedtime.    Marland Kitchen LORATADINE 10 MG PO TABS Oral Take 10 mg by mouth daily.    Marland Kitchen MEMANTINE HCL 5 MG PO TABS Oral Take 5 mg by mouth 2 (two) times daily.    Marland Kitchen OMEPRAZOLE 20 MG PO CPDR Oral Take 20 mg by mouth daily.    Marland Kitchen PANTOPRAZOLE SODIUM 20 MG PO TBEC Oral Take 20 mg by mouth daily.    Marland Kitchen RIVASTIGMINE 4.6 MG/24HR TD PT24 Transdermal Place 1 patch onto the skin daily.    . ALBUTEROL SULFATE HFA 108 (90 BASE) MCG/ACT IN AERS Inhalation Inhale 2 puffs into  the lungs every 4 (four) hours as needed. For shortness of breath/wheezing      BP 140/78  Pulse 65  Temp 97.9 F (36.6 C) (Oral)  Resp 15  SpO2 97%  Physical Exam  Constitutional: She appears well-developed and well-nourished. No distress.       Vital signs are reviewed and are normal.   HENT:  Head: Normocephalic. Head is with abrasion, with contusion and with right periorbital erythema. Head is without Battle's sign. No trismus in the jaw.    Right Ear: External ear normal. No hemotympanum.  Left Ear: External ear normal. No hemotympanum.  Nose: No nasal septal hematoma.  Eyes: EOM are normal. Pupils are equal, round, and reactive to light. Right eye exhibits no chemosis. Left eye exhibits no chemosis. Right conjunctiva has a hemorrhage.  Fundoscopic exam:      Right eye hemorrhage: none seen.       Left eye hemorrhage: none seen.  Slit lamp exam:      The right eye shows no hyphema.       The left eye shows no hyphema.         Swelling to right upper and lower lids- pt able to actively open eyelids. Visual fields full to confrontation bilaterally.  Neck: Trachea normal. Neck supple. No spinous process tenderness and no muscular tenderness present.  Cardiovascular: Normal rate and regular rhythm.   Murmur (systolic) heard. Pulmonary/Chest: Effort normal and breath sounds normal. No respiratory distress. She has no wheezes. She exhibits no tenderness.  Abdominal: Soft. Bowel sounds are normal. She exhibits no distension. There is no tenderness. There is no guarding.  Neurological: She is alert. She has normal strength. No cranial nerve deficit (3-12 intact) or sensory deficit (intact to light touch in all extremities distally). GCS eye subscore is 4. GCS verbal subscore is 5. GCS motor subscore is 6.       Oriented to person, place, time but not events  Skin: Skin is warm and dry.       See HENT exam    ED Course  Procedures (including critical care time)   Labs  Reviewed  CBC  BASIC METABOLIC PANEL  TROPONIN I  URINALYSIS, ROUTINE W REFLEX MICROSCOPIC   Dg Chest 2 View  07/24/2011  *RADIOLOGY REPORT*  Clinical Data: Nonproductive cough for 2 days, history hypertension  CHEST - 2 VIEW  Comparison: None Correlation:  CT abdomen 05/22/2011  Findings: Minimal enlargement of cardiac silhouette. Calcified thoracic aorta. Prominent right superior mediastinal soft tissues, could be related to a tortuous innominate artery though adenopathy not completely excluded. Density identified at the  medial left lung base, question consolidation, not seen on prior CT, making mass much less likely. Mild bibasilar atelectasis. Upper lungs clear. Bones demineralized.  IMPRESSION: Enlargement of cardiac silhouette. Bibasilar atelectasis with question area consolidation at the medial left lung base.  Original Report Authenticated By: Lollie Marrow, M.D.   Ct Head Wo Contrast  07/24/2011  *RADIOLOGY REPORT*  Clinical Data:  Status post fall.  Bruising about the right eye.  CT HEAD WITHOUT CONTRAST CT MAXILLOFACIAL WITHOUT CONTRAST CT CERVICAL SPINE WITHOUT CONTRAST  Technique:  Multidetector CT imaging of the head, cervical spine, and maxillofacial structures were performed using the standard protocol without intravenous contrast. Multiplanar CT image reconstructions of the cervical spine and maxillofacial structures were also generated.  Comparison:   None  CT HEAD  Findings: Extensive hypoattenuation in the subcortical periventricular deep white matter is consistent with chronic microvascular ischemic change.  No evidence of acute abnormality including infarction, hemorrhage, mass lesion, mass effect, midline shift or abnormal extra-axial fluid collection.  No hydrocephalus or pneumocephalus.  Mild mucosal thickening sphenoid sinuses and ethmoid air cells noted.  Calvarium intact.  IMPRESSION:  1.  No acute finding. 2.  Extensive chronic microvascular ischemic change. 3.  Mild appearing  sinus disease.  CT MAXILLOFACIAL  Findings:  No facial bone fracture is identified.  Mandibular condyles are located.  Globes are intact. Small amount hematoma is seen within the right orbit about the optic nerve.  The patient is status post bilateral lens extraction.  Mild mucosal thickening is seen in the maxillary sinuses, left sphenoid sinus and scattered ethmoid air cells.  IMPRESSION:  1.  Soft tissue contusion about the right eye. Small amount of intraorbital hematoma is identified. 2.  No facial bone fracture. 3.  Scattered, mild appearing sinus disease.  CT CERVICAL SPINE  Findings:   There is no fracture of the cervical spine.  0.3 cm of anterolisthesis of C4 on C5 is due to facet arthropathy.  The patient has multilevel degenerative disc disease.  Paraspinous structures are unremarkable.  IMPRESSION:  No acute finding.  Multilevel degenerative change.  Original Report Authenticated By: Bernadene Bell. Maricela Curet, M.D.   Ct Cervical Spine Wo Contrast  07/24/2011  *RADIOLOGY REPORT*  Clinical Data:  Status post fall.  Bruising about the right eye.  CT HEAD WITHOUT CONTRAST CT MAXILLOFACIAL WITHOUT CONTRAST CT CERVICAL SPINE WITHOUT CONTRAST  Technique:  Multidetector CT imaging of the head, cervical spine, and maxillofacial structures were performed using the standard protocol without intravenous contrast. Multiplanar CT image reconstructions of the cervical spine and maxillofacial structures were also generated.  Comparison:   None  CT HEAD  Findings: Extensive hypoattenuation in the subcortical periventricular deep white matter is consistent with chronic microvascular ischemic change.  No evidence of acute abnormality including infarction, hemorrhage, mass lesion, mass effect, midline shift or abnormal extra-axial fluid collection.  No hydrocephalus or pneumocephalus.  Mild mucosal thickening sphenoid sinuses and ethmoid air cells noted.  Calvarium intact.  IMPRESSION:  1.  No acute finding. 2.  Extensive  chronic microvascular ischemic change. 3.  Mild appearing sinus disease.  CT MAXILLOFACIAL  Findings:  No facial bone fracture is identified.  Mandibular condyles are located.  Globes are intact. Small amount hematoma is seen within the right orbit about the optic nerve.  The patient is status post bilateral lens extraction.  Mild mucosal thickening is seen in the maxillary sinuses, left sphenoid sinus and scattered ethmoid air cells.  IMPRESSION:  1.  Soft tissue contusion  about the right eye. Small amount of intraorbital hematoma is identified. 2.  No facial bone fracture. 3.  Scattered, mild appearing sinus disease.  CT CERVICAL SPINE  Findings:   There is no fracture of the cervical spine.  0.3 cm of anterolisthesis of C4 on C5 is due to facet arthropathy.  The patient has multilevel degenerative disc disease.  Paraspinous structures are unremarkable.  IMPRESSION:  No acute finding.  Multilevel degenerative change.  Original Report Authenticated By: Bernadene Bell. Maricela Curet, M.D.   Ct Maxillofacial Wo Cm  07/24/2011  *RADIOLOGY REPORT*  Clinical Data:  Status post fall.  Bruising about the right eye.  CT HEAD WITHOUT CONTRAST CT MAXILLOFACIAL WITHOUT CONTRAST CT CERVICAL SPINE WITHOUT CONTRAST  Technique:  Multidetector CT imaging of the head, cervical spine, and maxillofacial structures were performed using the standard protocol without intravenous contrast. Multiplanar CT image reconstructions of the cervical spine and maxillofacial structures were also generated.  Comparison:   None  CT HEAD  Findings: Extensive hypoattenuation in the subcortical periventricular deep white matter is consistent with chronic microvascular ischemic change.  No evidence of acute abnormality including infarction, hemorrhage, mass lesion, mass effect, midline shift or abnormal extra-axial fluid collection.  No hydrocephalus or pneumocephalus.  Mild mucosal thickening sphenoid sinuses and ethmoid air cells noted.  Calvarium intact.   IMPRESSION:  1.  No acute finding. 2.  Extensive chronic microvascular ischemic change. 3.  Mild appearing sinus disease.  CT MAXILLOFACIAL  Findings:  No facial bone fracture is identified.  Mandibular condyles are located.  Globes are intact. Small amount hematoma is seen within the right orbit about the optic nerve.  The patient is status post bilateral lens extraction.  Mild mucosal thickening is seen in the maxillary sinuses, left sphenoid sinus and scattered ethmoid air cells.  IMPRESSION:  1.  Soft tissue contusion about the right eye. Small amount of intraorbital hematoma is identified. 2.  No facial bone fracture. 3.  Scattered, mild appearing sinus disease.  CT CERVICAL SPINE  Findings:   There is no fracture of the cervical spine.  0.3 cm of anterolisthesis of C4 on C5 is due to facet arthropathy.  The patient has multilevel degenerative disc disease.  Paraspinous structures are unremarkable.  IMPRESSION:  No acute finding.  Multilevel degenerative change.  Original Report Authenticated By: Bernadene Bell. Maricela Curet, M.D.    Date: 07/24/2011  Rate: 54  Rhythm: normal sinus rhythm  QRS Axis: normal  Intervals: normal  ST/T Wave abnormalities: normal  Conduction Disutrbances:none  Old EKG Reviewed: unchanged from prior tracing dated 05/22/2011      MDM  Unwitnessed fall without pt recall of events. Injury to left face/eye. Visual fields full bilaterally with EOMI, subconjunctival hemorrhage seen on exam. CT c-spine without acute findings. CT maxillofacial with soft tissue contusion as seen on exam, small amount of intraorbital hematoma (which is not seen on fundoscopic exam). CT head with no acute findings aside from mild sinus disease. CXR with ? Consolidation- pt with mild cough but no hypoxia, SOB, or fever- will await WBC count to assist in tx decision. Labs are pending. Report is given to [facility], who will call for admit once labs have resulted.        Shaaron Adler,  [place] 07/24/11 1617

## 2011-07-24 NOTE — ED Notes (Addendum)
Patient transported to X-ray and CT 

## 2011-07-24 NOTE — ED Notes (Addendum)
Patient states she did not feel well and was laying in her bed this morning and when she got up she fell in the floor. Skin around patient's right eye appears red and inter portion of the eye next to patients nose is red/purple. Patient skin is red around outer portion is the right eye with bruise on patient forehead. Patient given ice pack and is resting at this time with NAD.  Patient has a Exalon patch on her right shoulder.

## 2011-07-24 NOTE — Progress Notes (Signed)
Patient arrived to the floor from the ED coming from [facility]. Patient was alert to self and disoriented to place. Vitals were stable and placed on telemetry. Patient skin was intact and has a bruise and swelling on right eye from a fall this morning at home. Family member made aware of new room change. Patient was oriented to the room and to the staff and unit. Reviewed orders. Will continue to monitor the patient.  Doristine Devoid RN

## 2011-07-25 ENCOUNTER — Encounter: Payer: Self-pay | Admitting: Family Medicine

## 2011-07-25 ENCOUNTER — Observation Stay (HOSPITAL_COMMUNITY): Payer: Medicare Other

## 2011-07-25 LAB — BASIC METABOLIC PANEL
BUN: 32 mg/dL — ABNORMAL HIGH (ref 6–23)
CO2: 24 mEq/L (ref 19–32)
Chloride: 100 mEq/L (ref 96–112)
Creatinine, Ser: 1.19 mg/dL — ABNORMAL HIGH (ref 0.50–1.10)
Glucose, Bld: 95 mg/dL (ref 70–99)

## 2011-07-25 LAB — CBC
HCT: 38.1 % (ref 36.0–46.0)
MCH: 31 pg (ref 26.0–34.0)
MCHC: 34.1 g/dL (ref 30.0–36.0)
MCV: 90.7 fL (ref 78.0–100.0)
RDW: 12.9 % (ref 11.5–15.5)
WBC: 8.9 10*3/uL (ref 4.0–10.5)

## 2011-07-25 MED ORDER — BENZONATATE 100 MG PO CAPS
100.0000 mg | ORAL_CAPSULE | Freq: Two times a day (BID) | ORAL | Status: DC | PRN
  Administered 2011-07-25 (×2): 100 mg via ORAL
  Filled 2011-07-25 (×3): qty 1

## 2011-07-25 NOTE — Progress Notes (Addendum)
Clinical Child psychotherapist (CSW) informed by Archie Patten at [facility] that pt does not need an FL2 since she was placed on observation and only here for 1 day. CSW faxed pt dc summary, AVS, HH PT/OT order, observation order and pt diet order. No further CSW requests as pt son will transport pt to the facility. CSW placed packet (to include the above documents) in pt shadow chart and informed pt RN.  CSW signing off. Theresia Bough, MSW, Theresia Majors [phone]

## 2011-07-25 NOTE — Clinical Social Work Psychosocial (Signed)
     Clinical Social Work Department BRIEF PSYCHOSOCIAL ASSESSMENT 07/25/2011  Patient:  Tamara Gonzales, Tamara Gonzales     Account Number:  [id number]     Admit date:  07/24/2011  Clinical Social Worker:  Lourdes Sledge  Date/Time:  07/25/2011 12:03 PM  Referred by:  Physician  Date Referred:  07/25/2011 Referred for  ALF Placement   Other Referral:   Interview type:  Family Other interview type:   CSW spoke with pt son Aaliyha Mumford [phone]    PSYCHOSOCIAL DATA Living Status:  FACILITY Admitted from facility:  [facility] Level of care:  Assisted Living Primary support name:  Dyesha Henault [phone] Primary support relationship to patient:  CHILD, ADULT Degree of support available:   Pt is from [facility]. Pt son is supportive and involved in her care.    CURRENT CONCERNS Current Concerns  Post-Acute Placement   Other Concerns:    SOCIAL WORK ASSESSMENT / PLAN CSW informed that pt is from an ALF.    CSW contacted pt son, as pt presents with dementia. CSW confirmed with son that pt is from [facility] and the plan is for pt to return when medically stable. Son also stated he was on his way to pickup pt.    CSW confirmed with Kenney Houseman the nurse at [facility] that pt okay to return today. CSW also informed that pt did not need an FL2 as pt has only been here for 1 day and is on observation. Pt will need an order for homehealth PT/OT at dc. CSW will fax order to facility.   Assessment/plan status:  Psychosocial Support/Ongoing Assessment of Needs Other assessment/ plan:   Information/referral to community resources:   CSW informed son that CSW could arrange a non emergency ambulance to pt ALF however son stated he felt comfortable transporting pt. CSW also informd pt that CSW would communicate with facility for pt return and ensure that an order for [facility] was placed. Pt son appreciative.    PATIENTS/FAMILYS RESPONSE TO PLAN OF CARE: Pt presents with  dementia. CSW confirmed that pt is from [facility] and the plan is for pt to return. Pt son very pleasant and appreciative of assistance.

## 2011-07-25 NOTE — Progress Notes (Signed)
Physical Therapy Evaluation Patient Details Name: Tamara Gonzales MRN: [id number] DOB: [birth year] Today's Date: 07/25/2011 Time: 0630-1601 PT Time Calculation (min): 24 min  PT Assessment / Plan / Recommendation Clinical Impression  78 yo female admitted with orthostatic hypotension presents to PT with some gait and balance dysfunction; Will benefit from PT to address gait deviations, and evaluate for possible benefit of RW    PT Assessment  Patient needs continued PT services    Follow Up Recommendations  Home health PT;Supervision/Assistance - 24 hour (worth considering HHPT f/u at ALF post dc) Home Health PT can also address possible benefit of RW if dc's soon   Barriers to Discharge Other (comment) Home to familiar environment is often the most therapeutic option for pt's with demetia; Apparently, however, pt's current assisted living apartment was not held; Pt is appropriate for dc to an assisted living level of care/ memory care    lEquipment Recommendations   (Will consider RW)    Recommendations for Other Services  (SW to facilitate dc to ALF/Memory Care)   Frequency Min 3X/week    Precautions / Restrictions Precautions Precautions: Fall Restrictions Weight Bearing Restrictions: No   Pertinent Vitals/Pain no apparent distress       Mobility  Bed Mobility Details for Bed Mobility Assistance:  (pt presented in chair) Transfers Transfers: Sit to Stand;Stand to Sit Sit to Stand: 4: Min guard;With upper extremity assist (without physical contact) Stand to Sit: 4: Min guard;To chair/3-in-1;With armrests (without physical contact) Details for Transfer Assistance: Minguard for safety precaution, however pt did not need physical assist Ambulation/Gait Ambulation/Gait Assistance: 4: Min guard (without physical contact) Ambulation Distance (Feet): 200 Feet Assistive device: None Ambulation/Gait Assistance Details: Occasional erratic step width, with pt correctly and  appropriately identifying that she's unsteady; Noted decr arm swing and slow gait speed, but no overt loss of balance during our walk Gait Pattern: Decreased step length - right;Decreased step length - left Gait velocity: slow    Exercises     PT Diagnosis: Abnormality of gait;Difficulty walking  PT Problem List: Decreased balance;Decreased mobility;Decreased coordination;Decreased knowledge of use of DME PT Treatment Interventions: DME instruction;Gait training;Functional mobility training;Therapeutic activities;Stair training;Balance training;Patient/family education   PT Goals Acute Rehab PT Goals PT Goal Formulation: Patient unable to participate in goal setting Time For Goal Achievement: 08/08/11 Potential to Achieve Goals: Good Pt will go Supine/Side to Sit: with modified independence PT Goal: Supine/Side to Sit - Progress: Goal set today Pt will go Sit to Supine/Side: with modified independence PT Goal: Sit to Supine/Side - Progress: Goal set today Pt will go Sit to Stand: with modified independence PT Goal: Sit to Stand - Progress: Goal set today Pt will go Stand to Sit: with modified independence PT Goal: Stand to Sit - Progress: Goal set today Pt will Ambulate: >150 feet;with modified independence;with least restrictive assistive device (with LRAD versus none) PT Goal: Ambulate - Progress: Goal set today  Visit Information  Last PT Received On: 07/25/11 Assistance Needed: +1    Subjective Data  Subjective: Reports feels better; "There are exit signs all over this building!" pt stated during our walk Patient Stated Goal: wants to go home   Prior Functioning  Home Living Lives With:  (Assisted Living Facility) Available Help at Discharge: Available 24 hours/day (ALF) Type of Home: Assisted living Home Access: Level entry Additional Comments: Pt. is not able to return to ALF because her apt is no longer available Prior Function Level of Independence: Needs  assistance Needs Assistance:  Meal Prep;Light Housekeeping Meal Prep: Total Light Housekeeping: Maximal Able to Take Stairs?: No Driving: No Vocation: Retired Musician: No difficulties    Cognition  Overall Cognitive Status: History of cognitive impairments - at baseline (with history of dementia) Arousal/Alertness: Awake/alert Orientation Level: Disoriented to;Place;Time;Situation Behavior During Session: WFL for tasks performed Cognition - Other Comments: Pt very appropriate and pleasant during session; Memeroy defecits with history of dementai is evident -- pt did not rmember working with OT earlier this am    Extremity/Trunk Assessment Right Upper Extremity Assessment RUE ROM/Strength/Tone: [facility] for tasks assessed Left Upper Extremity Assessment LUE ROM/Strength/Tone: WFL for tasks assessed Right Lower Extremity Assessment RLE ROM/Strength/Tone: [facility] for tasks assessed Left Lower Extremity Assessment LLE ROM/Strength/Tone: WFL for tasks assessed   Balance    End of Session PT - End of Session Equipment Utilized During Treatment:  (close guard) Activity Tolerance: Patient tolerated treatment well (pt reported no dizziness) Patient left: in chair;with chair alarm set;with nursing in room (Nurse tech taking vitals) Nurse Communication: Precautions   Van Clines Hamff 07/25/2011, 10:39 AM

## 2011-07-25 NOTE — Progress Notes (Signed)
  Echocardiogram 2D Echocardiogram has been performed.  Tamara Gonzales 07/25/2011, 1:39 PM

## 2011-07-25 NOTE — H&P (Signed)
FMTS Attending Admission Note: Marcellis Frampton MD [phone] pager office [phone] I  have seen and examined this patient, reviewed their chart. I have discussed this patient with the resident. I agree with the resident's findings, assessment and care plan. 

## 2011-07-25 NOTE — Discharge Summary (Signed)
Family Medicine Teaching Service  Discharge Note : Attending Viet Kemmerer MD Pager [phone] Office [phone] I have seen and examined this patient, reviewed their chart and discussed discharge planning wit the resident at the time of discharge. I agree with the discharge plan as above.  

## 2011-07-25 NOTE — Evaluation (Signed)
Occupational Therapy Evaluation Patient Details Name: Tamara Gonzales MRN: [id number] DOB: 1919/08/01 Today's Date: 07/25/2011 Time: 7829-5621 OT Time Calculation (min): 12 min  OT Assessment / Plan / Recommendation Clinical Impression  Pt. presents s/p fall due to syncopal episode. Pt. will benefit from skilled OT to increase functional independence to supervision level at D/C    OT Assessment  Patient needs continued OT Services    Follow Up Recommendations  Return to ALF setting with [facility] services and 24hr supervision     Barriers to Discharge None    Equipment Recommendations  Defer to next venue       Frequency  Min 2X/week    Precautions / Restrictions Precautions Precautions: Fall Restrictions Weight Bearing Restrictions: No       ADL  Eating/Feeding: Performed;Set up Where Assessed - Eating/Feeding: Chair Grooming: Simulated;Wash/dry hands;Wash/dry face;Set up;Supervision/safety Where Assessed - Grooming: Unsupported sitting Upper Body Bathing: Simulated;Set up Where Assessed - Upper Body Bathing: Unsupported sitting Lower Body Bathing: Simulated;Minimal assistance Where Assessed - Lower Body Bathing: Unsupported sit to stand Upper Body Dressing: Performed;Minimal assistance (don gown) Where Assessed - Upper Body Dressing: Unsupported sitting Lower Body Dressing: Simulated;Moderate assistance Where Assessed - Lower Body Dressing: Unsupported sit to stand Toilet Transfer: Simulated;Minimal assistance Toilet Transfer Method: Stand pivot Toilet Transfer Equipment: Other (comment) Nurse, children's) Toileting - Clothing Manipulation and Hygiene: Simulated;Minimal assistance Where Assessed - Glass blower/designer Manipulation and Hygiene: Standing Tub/Shower Transfer Method: Not assessed Transfers/Ambulation Related to ADLs: Pt. min verbal cues for transfer technique for safety with hand placement ADL Comments: Pt. demonstrated ability to reach down towards bil feet with  increased time and SOB noted with bending forward. Educated pt. on crossing foot over opposite knee to complete LB ADL tasks    OT Diagnosis: Generalized weakness;Cognitive deficits  OT Problem List: Decreased strength;Decreased activity tolerance;Decreased cognition;Decreased safety awareness;Decreased knowledge of use of DME or AE OT Treatment Interventions: Self-care/ADL training;DME and/or AE instruction;Therapeutic activities;Patient/family education;Balance training   OT Goals Acute Rehab OT Goals OT Goal Formulation: With patient Time For Goal Achievement: 08/01/11 Potential to Achieve Goals: Good ADL Goals Pt Will Perform Upper Body Dressing: with set-up;with supervision;Standing ADL Goal: Upper Body Dressing - Progress: Goal set today Pt Will Perform Lower Body Dressing: with set-up;with supervision;Sit to stand from chair ADL Goal: Lower Body Dressing - Progress: Goal set today Pt Will Transfer to Toilet: with supervision;Ambulation;with DME ADL Goal: Toilet Transfer - Progress: Goal set today Additional ADL Goal #1: Pt. will recall 3 energy conservation techniques to use with ADLs ADL Goal: Additional Goal #1 - Progress: Goal set today  Visit Information  Last OT Received On: 07/25/11 Assistance Needed: +1    Subjective Data  Subjective: "my kids tricked me" Patient Stated Goal: "I want to go home"   Prior Functioning  Home Living Lives With: Other (Comment) (ALF) Available Help at Discharge: Available 24 hours/day;Other (Comment) (ALF) Type of Home: Assisted living Home Access: Level entry Additional Comments: Pt. is not able to return to ALF because her apt is no longer available Prior Function Level of Independence: Needs assistance Needs Assistance: Meal Prep;Light Housekeeping Meal Prep: Total Light Housekeeping: Maximal Able to Take Stairs?: No Driving: No Vocation: Retired Musician: No difficulties    Cognition  Overall Cognitive  Status: History of cognitive impairments - at baseline Arousal/Alertness: Awake/alert Orientation Level: Disoriented to;Place;Time;Situation Behavior During Session: WFL for tasks performed Cognition - Other Comments: Pt. very pleasant, however with memory deficits.    Extremity/Trunk Assessment Right Upper  Extremity Assessment RUE ROM/Strength/Tone: [facility] for tasks assessed Left Upper Extremity Assessment LUE ROM/Strength/Tone: [facility] for tasks assessed Right Lower Extremity Assessment RLE ROM/Strength/Tone: [facility] for tasks assessed Left Lower Extremity Assessment LLE ROM/Strength/Tone: WFL for tasks assessed   Mobility Bed Mobility Bed Mobility: Supine to Sit Supine to Sit: 4: Min assist;With rails Details for Bed Mobility Assistance: Min verbal cues for hand placement Transfers Transfers: Sit to Stand;Stand to Sit Sit to Stand: 4: Min guard;With upper extremity assist Stand to Sit: 4: Min guard;To chair/3-in-1;With armrests Details for Transfer Assistance: [place] for safety precaution, however pt did not need physical assist         End of Session OT - End of Session Equipment Utilized During Treatment: Gait belt Activity Tolerance: Patient tolerated treatment well Patient left: in chair;with call bell/phone within reach;with nursing in room Nurse Communication: Mobility status   Cassandria Anger, OTR/L Pager [phone] 07/25/2011, 11:38 AM

## 2011-07-25 NOTE — Progress Notes (Signed)
Utilization review completed. Zian Delair RN, CCM  

## 2011-07-25 NOTE — ED Provider Notes (Signed)
Medical screening examination/treatment/procedure(s) were conducted as a shared visit with non-physician practitioner(s) and myself.  I personally evaluated the patient during the encounter  Apparent syncopal episode with fall from bed. Right eye contusion.  EOMI intact. Vision at baseline. No C spine pain. No chest pain or SOB.    Glynn Octave, MD 07/25/11 [phone]

## 2011-07-25 NOTE — Discharge Instructions (Signed)
You were in the hospital for fainting.  This is probably because your blood pressure got too low.  1. STOP taking lisinopril (this may be causing your cough) 2. STOP taking hydralazine  Please drink more fluids - at least 3 glasses of water or tea a day.

## 2011-07-25 NOTE — Progress Notes (Signed)
FMTS Attending Daily Note: Denny Levy MD [phone] pager office [phone] I  have seen and examined this patient, reviewed their chart. I have discussed this patient with the resident. I agree with the resident's findings, assessment and care plan. I think her syncope related to BP meds. Would suggest we hold hydralazine and recently added ACE (as she is having cough). Consider ECHO, either before d/c or if that is going to delay d/c then ECHO outpatient. I suspect she may have some aortic stenosis which would make use of vasodilators like hydralazine maybe less useful.

## 2011-07-25 NOTE — ED Provider Notes (Signed)
Medical screening examination/treatment/procedure(s) were performed by non-physician practitioner and as supervising physician I was immediately available for consultation/collaboration.  Flint Melter, MD 07/25/11 2018

## 2011-07-25 NOTE — Progress Notes (Signed)
FMTS Daily Intern Progress Note  Subjective: Doing well this morning.  No chest pain, dyspnea, dizziness.  Continues to have cough, but feels it is a little better.  Orthostatic vitals negative.  Ready to go home.  I have reviewed the patient's medications.  Objective Temp:  [97.8 F (36.6 C)-98.8 F (37.1 C)] 97.8 F (36.6 C) (06/18 0551) Pulse Rate:  [55-65] 61  (06/18 0555) Resp:  [15-18] 18  (06/18 0555) BP: (109-167)/(46-78) 125/75 mmHg (06/18 0555) SpO2:  [90 %-97 %] 90 % (06/18 0555) Weight:  [113 lb 12.1 oz (51.6 kg)] 113 lb 12.1 oz (51.6 kg) (06/17 2045)  No intake or output data in the 24 hours ending 07/25/11 0920  CBG (last 3)  No results found for this basename: GLUCAP:3 in the last 72 hours  General: alert, cooperative, NAD HEENT: AT/[place], right black eye, MMM CV: RRR, 2/6 SEM at upper sternal borders Pulm: CTAB, mild bibasilar crackles that cleared with a deep breath Ext: no edema, WWP Neuro: alert, moving all extremities  Labs and Imaging  Lab 07/25/11 0645 07/24/11 2143 07/24/11 1604  WBC 8.9 10.0 9.6  HGB 13.0 13.0 13.5  HCT 38.1 38.0 39.2  PLT 191 180 203     Lab 07/25/11 0645 07/24/11 2143 07/24/11 1604  NA 135 -- 134*  K 3.7 -- 4.1  CL 100 -- 98  CO2 24 -- 24  BUN 32* -- 40*  CREATININE 1.19* 1.37* 1.42*  LABGLOM -- -- --  GLUCOSE 95 -- --  CALCIUM 9.3 -- 9.7    Assessment and Plan Rashell Skolnick is a 79 y.o. year old female with hypertension and dementia presenting with a syncopal episode.   # Syncope: Likely orthostatic hypotension. Differential also includes cardiac arrhythmia, ACS, infection (pneumonia, UTI), seizure, electrolyte disturbances, stroke. However, given history and lab/ekg findings these seem unlikely. Did have what sounds like carotid dopplers a year ago that showed stenosis; however, no evidence for a stroke and this is unlikely to cause acute syncope. Orthostatics are negative and no events on telemetry. - f/u PT/OT  -  continue HCTZ - will continue to hold lisinopril and hydralazine at discharge    # Cough: Likely related to starting lisinopril given time course. However, given that her chest x-ray showed area of questionable consolidation will monitor.  Repeat CXR showed improvement of area of possible consolidation.  - discontinue lisinopril  # Mild hyponatremia: Likely secondary to mild dehydration.  Improved with IVFs.  # Hypertension: Mildly elevated in the ED to the 160s systolic.  Well controlled this morning on HCTZ alone. - will hold home lisinopril and hydralazine given syncopal episode   # CKD stage III: Creatinine at baseline.  - will monitor   # Hyperlipidemia: Stable. Continue home meds   # Hypothyroidism  - will check TSH  - continue home medicine   # Dementia: Stable per family.  - continue home medications   FEN/GI: heart healthy diet; SLIV  Prophylaxis: SQ heparin  Disposition: pending PT/OT evaluation   BOOTH, Kylii Ennis Pager: [phone] 07/25/2011, 9:20 AM

## 2011-07-25 NOTE — Progress Notes (Signed)
Pt discharged to facility via family transport. Pt and family educated on medication changes and increased safety measures as applicable at the facility. Pt is hemodynamically stable. Telephoned report to [facility], spoke with nurse Archie Patten. Dondra Spry

## 2011-07-25 NOTE — Discharge Summary (Signed)
Discharge Summary 07/25/2011 12:21 PM  Tamara Gonzales DOB: 07-31-1919 MRN: [id number]  Date of Admission: 07/24/2011 Date of Discharge: 07/25/2011  PCP: Florentina Jenny, MD Consultants: none  Reason for Admission: syncope  Discharge Diagnosis Primary 1. Syncope - orthostatic vs aortic stenosis Secondary 1. Cough 2. HTN 3. Dementia 4. Hypothyroidism  Hospital Course: Tamara Gonzales is a 78 y.o. year old female with hypertension and dementia presenting with a syncopal episode.   1. Syncope: Likely orthostatic hypotension given history of relatively poor fluid intake and change in BP meds and normal physical exam.  Troponin and EKG were negative in the ED.  CT of head, face and neck were negative for acute issues.  No fever or white count to suggest infection leading to dehydration.  Orthostatics were negative and she had no events on telemetry.  PT/OT evaluated her and recommend home health OT/PT as well as possibly a rolling walker.  An echo was obtained to evaluate her aortic valve as severe aortic stenosis can lead to syncope.  The final read of this was pending at discharge.  2. Cough: Likely related to starting lisinopril given time course. However, given that her chest x-ray showed area of questionable consolidation will monitor. Repeat CXR showed improvement of area of possible consolidation.  Will not restart lisinopril and defer to PCP for further work up.   3.  Hypertension: Mildly elevated in the ED to the 160s systolic. Her lisinopril and hydralazine were discontinued due to concern of orthostatic hypotension vs pre-load dependent aortic stenosis.  It was well controlled at discharge on HCTZ alone.   4. Hypothyroidism: Stable.  Continued home levothyroxine dose.    5. Dementia: Stable this admission per family.  Home medications were continued.  Procedures: none  Discharge Medications Medication List  As of 07/25/2011 12:21 PM   CONTINUE taking these medications         *  albuterol 108 (90 BASE) MCG/ACT inhaler   Commonly known as: PROVENTIL HFA;VENTOLIN HFA      * albuterol 108 (90 BASE) MCG/ACT inhaler   Commonly known as: PROVENTIL HFA;VENTOLIN HFA      atorvastatin 20 MG tablet   Commonly known as: LIPITOR      Calcium 600+D 600-400 MG-UNIT per tablet   Generic drug: Calcium Carbonate-Vitamin D      cholecalciferol 1000 UNITS tablet   Commonly known as: VITAMIN D      citalopram 10 MG tablet   Commonly known as: CELEXA      docusate sodium 100 MG capsule   Commonly known as: COLACE      fluticasone 27.5 MCG/SPRAY nasal spray   Commonly known as: VERAMYST      gabapentin 100 MG capsule   Commonly known as: NEURONTIN      guaifenesin 100 MG/5ML syrup   Commonly known as: ROBITUSSIN      hydrochlorothiazide 25 MG tablet   Commonly known as: HYDRODIURIL      levothyroxine 75 MCG tablet   Commonly known as: SYNTHROID, LEVOTHROID      loratadine 10 MG tablet   Commonly known as: CLARITIN      memantine 5 MG tablet   Commonly known as: NAMENDA      omeprazole 20 MG capsule   Commonly known as: PRILOSEC      pantoprazole 20 MG tablet   Commonly known as: PROTONIX      rivastigmine 4.6 mg/24hr   Commonly known as: EXELON     * Notice: This list  has 2 medication(s) that are the same as other medications prescribed for you. Read the directions carefully, and ask your doctor or other care provider to review them with you.       STOP taking these medications         hydrALAZINE 25 MG tablet      lisinopril 5 MG tablet            Pertinent Hospital Labs  Lab 07/25/11 0645 07/24/11 2143 07/24/11 1604  WBC 8.9 10.0 9.6  HGB 13.0 13.0 13.5  HCT 38.1 38.0 39.2  PLT 191 180 203     Lab 07/25/11 0645 07/24/11 2143 07/24/11 1604  NA 135 -- 134*  K 3.7 -- 4.1  CL 100 -- 98  CO2 24 -- 24  BUN 32* -- 40*  CREATININE 1.19* 1.37* 1.42*  LABGLOM -- -- --  GLUCOSE 95 -- --  CALCIUM 9.3 -- 9.7    Lab 07/24/11 1604    CKTOTAL --  TROPONINI <0.30  TROPONINT --  CKMBINDEX --   EKG x2: nsr, no ST changes  CXR:  Enlargement of cardiac silhouette.  Bibasilar atelectasis with question area consolidation at the  medial left lung base.  Repeat CXR: Changes of COPD with basilar atelectasis.  Enlargement of cardiac silhouette.  Area of questionable infiltrate/consolidation in medial left lung  base is less well visualized on current exam, recommend attention  on follow-up studies.  CT head 1. No acute finding.  2. Extensive chronic microvascular ischemic change.  3. Mild appearing sinus disease.  CT Maxillofacial 1. Soft tissue contusion about the right eye. Small amount of  intraorbital hematoma is identified.  2. No facial bone fracture.  3. Scattered, mild appearing sinus disease.  CT cervical spine No acute finding. Multilevel degenerative change.  Discharge instructions: see AVS  Condition at discharge: stable  Disposition: to ALF with HHPT/OT  Pending Tests: read on echo, TSH  Follow up: Follow-up Information    Schedule an appointment as soon as possible for a visit with Florentina Jenny, MD.   Contact information:   [address] [place]. [address] [place] [address] [phone]          Follow up Issues:  1. Assess for need for rolling walker 2. All of her home anti-hypertensive were stopped except for HCTZ given concern for possible orthostatic hypotension vs pre-load requiring aortic stenosis. 3. Please re-assess for cough - may be due to the lisinopril.   4. An echo was done during this admission; final read not back at time of discharge.  BOOTH, Kayde Atkerson 07/25/2011, 12:21 PM

## 2012-06-29 ENCOUNTER — Encounter (HOSPITAL_COMMUNITY): Payer: Self-pay

## 2012-06-29 ENCOUNTER — Emergency Department (HOSPITAL_COMMUNITY)
Admission: EM | Admit: 2012-06-29 | Discharge: 2012-06-29 | Disposition: A | Discharge status: Skilled Nursing Facility | Payer: Medicare Other | Attending: Emergency Medicine | Admitting: Emergency Medicine

## 2012-06-29 DIAGNOSIS — E039 Hypothyroidism, unspecified: Secondary | ICD-10-CM | POA: Insufficient documentation

## 2012-06-29 DIAGNOSIS — R296 Repeated falls: Secondary | ICD-10-CM | POA: Insufficient documentation

## 2012-06-29 DIAGNOSIS — F0390 Unspecified dementia without behavioral disturbance: Secondary | ICD-10-CM | POA: Insufficient documentation

## 2012-06-29 DIAGNOSIS — Z79899 Other long term (current) drug therapy: Secondary | ICD-10-CM | POA: Insufficient documentation

## 2012-06-29 DIAGNOSIS — T148XXA Other injury of unspecified body region, initial encounter: Secondary | ICD-10-CM

## 2012-06-29 DIAGNOSIS — IMO0002 Reserved for concepts with insufficient information to code with codable children: Secondary | ICD-10-CM | POA: Insufficient documentation

## 2012-06-29 DIAGNOSIS — I10 Essential (primary) hypertension: Secondary | ICD-10-CM | POA: Insufficient documentation

## 2012-06-29 DIAGNOSIS — Y9301 Activity, walking, marching and hiking: Secondary | ICD-10-CM | POA: Insufficient documentation

## 2012-06-29 DIAGNOSIS — Y9289 Other specified places as the place of occurrence of the external cause: Secondary | ICD-10-CM | POA: Insufficient documentation

## 2012-06-29 DIAGNOSIS — W19XXXA Unspecified fall, initial encounter: Secondary | ICD-10-CM

## 2012-06-29 DIAGNOSIS — K5900 Constipation, unspecified: Secondary | ICD-10-CM | POA: Insufficient documentation

## 2012-06-29 NOTE — ED Notes (Addendum)
Per ems- pt from [facility], pt fell over dog today. Pt did hit head, no loc. Denies any neck/back pain. Pt has noted abrasion to Bilateral knees and pt has right sided swelling noted to face. Pt oriented to baseline, hx of dementia. VSS. Bp-160/90 HR-60

## 2012-06-29 NOTE — ED Provider Notes (Signed)
History     CSN: [id number]  Arrival date & time 06/29/12  1108   First MD Initiated Contact with Patient 06/29/12 1112      Chief Complaint  Patient presents with  . Fall    HPI Patient was walking the dog when it jumped and pulled her down.  She suffered abrasions to both knees and head.  Patient had no loss of consciousness and denies headache.  Denies neck pain.  Denies weakness or numbness.  States she is to return knees and he just feels "skinned up" patient does not currently want imaging unless absolutely necessary.  Past Medical History  Diagnosis Date  . Hypertension   . Dementia   . Thyroid disease     Hypothyroidism  . Constipation     Past Surgical History  Procedure Laterality Date  . Hernia repair      History reviewed. No pertinent family history.  History  Substance Use Topics  . Smoking status: Never Smoker   . Smokeless tobacco: Not on file  . Alcohol Use: No    OB History   Grav Para Term Preterm Abortions TAB SAB Ect Mult Living                  Review of Systems All other systems reviewed and are negative Allergies  Ace inhibitors  Home Medications   Current Outpatient Rx  Name  Route  Sig  Dispense  Refill  . albuterol (PROVENTIL HFA;VENTOLIN HFA) 108 (90 BASE) MCG/ACT inhaler   Inhalation   Inhale 2 puffs into the lungs every 4 (four) hours as needed. For shortness of breath/wheezing         . albuterol (PROVENTIL HFA;VENTOLIN HFA) 108 (90 BASE) MCG/ACT inhaler   Inhalation   Inhale 2 puffs into the lungs every 4 (four) hours as needed. For shortness of breath         . atorvastatin (LIPITOR) 20 MG tablet   Oral   Take 20 mg by mouth daily.         . Calcium Carbonate-Vitamin D (CALCIUM 600+D) 600-400 MG-UNIT per tablet   Oral   Take 1 tablet by mouth daily.         . cholecalciferol (VITAMIN D) 1000 UNITS tablet   Oral   Take 1,000 Units by mouth daily.         . citalopram (CELEXA) 10 MG tablet   Oral  Take 10 mg by mouth daily.         Marland Kitchen docusate sodium (COLACE) 100 MG capsule   Oral   Take 100 mg by mouth 2 (two) times daily.         . fluticasone (VERAMYST) 27.5 MCG/SPRAY nasal spray   Nasal   Place 1 spray into the nose daily.         Marland Kitchen gabapentin (NEURONTIN) 100 MG capsule   Oral   Take 200 mg by mouth 2 (two) times daily.         Marland Kitchen guaifenesin (ROBITUSSIN) 100 MG/5ML syrup   Oral   Take 300 mg by mouth every 4 (four) hours as needed. For cough         . hydrochlorothiazide (HYDRODIURIL) 25 MG tablet   Oral   Take 25 mg by mouth daily.         Marland Kitchen levothyroxine (SYNTHROID, LEVOTHROID) 75 MCG tablet   Oral   Take 75 mcg by mouth daily.         Marland Kitchen  loratadine (CLARITIN) 10 MG tablet   Oral   Take 10 mg by mouth daily.         . memantine (NAMENDA) 5 MG tablet   Oral   Take 5 mg by mouth 2 (two) times daily.         Marland Kitchen omeprazole (PRILOSEC) 20 MG capsule   Oral   Take 20 mg by mouth daily.         . pantoprazole (PROTONIX) 20 MG tablet   Oral   Take 20 mg by mouth daily.         . rivastigmine (EXELON) 4.6 mg/24hr   Transdermal   Place 1 patch onto the skin daily.           BP 185/84  Pulse 62  Temp(Src) 97.7 F (36.5 C) (Oral)  Resp 20  SpO2 95%  Physical Exam  Nursing note and vitals reviewed. Constitutional: She is oriented to person, place, and time. She appears well-developed and well-nourished. No distress.  HENT:  Head: Normocephalic.    Eyes: Pupils are equal, round, and reactive to light.  Neck: Normal range of motion.  Cardiovascular: Normal rate and intact distal pulses.   Pulmonary/Chest: No respiratory distress.  Abdominal: Normal appearance. She exhibits no distension.  Musculoskeletal: Normal range of motion.       Right knee: She exhibits normal range of motion and no swelling.       Left knee: She exhibits normal range of motion and no swelling.       Legs: Neurological: She is alert and oriented to person,  place, and time. No cranial nerve deficit.  Skin: Skin is warm and dry. No rash noted.  Psychiatric: She has a normal mood and affect. Her behavior is normal.    ED Course  Procedures (including critical care time) Patient ambulated with no difficulty or complaints.  Complain about: Applied to abrasions.  No imaging required. Labs Reviewed - No data to display No results found.   1. Accidental fall, initial encounter   2. Abrasion       MDM          Nelia Shi, MD 06/29/12 1251

## 2012-06-29 NOTE — ED Notes (Signed)
Patient states that she was walking her dog this AM and the dog jumped at something and pulled her down.  She is alert, oriented and cooperative.  Bruising and abrasions are noted on the right side of her face and forehead.  Abrasions noted on bilateral knees. Patient states that she landed on her knees then fell forward. C-Collar applied.  Patient states that she is OK other than a few bumps.

## 2012-06-29 NOTE — ED Notes (Signed)
Bacitracin applied to abraisions

## 2013-03-28 ENCOUNTER — Other Ambulatory Visit: Payer: Self-pay | Admitting: *Deleted

## 2013-03-28 DIAGNOSIS — R0682 Tachypnea, not elsewhere classified: Secondary | ICD-10-CM

## 2013-03-31 ENCOUNTER — Ambulatory Visit
Admission: RE | Admit: 2013-03-31 | Discharge: 2013-03-31 | Disposition: A | Discharge status: Home / Self Care | Payer: Medicare Other | Source: Ambulatory Visit | Attending: *Deleted | Admitting: *Deleted

## 2013-03-31 DIAGNOSIS — R0682 Tachypnea, not elsewhere classified: Secondary | ICD-10-CM

## 2013-03-31 MED ORDER — IOHEXOL 350 MG/ML SOLN
125.0000 mL | Freq: Once | INTRAVENOUS | Status: AC | PRN
  Administered 2013-03-31: 125 mL via INTRAVENOUS

## 2013-11-15 ENCOUNTER — Encounter (HOSPITAL_COMMUNITY): Payer: Self-pay | Admitting: Emergency Medicine

## 2013-11-15 ENCOUNTER — Emergency Department (HOSPITAL_COMMUNITY)
Admission: EM | Admit: 2013-11-15 | Discharge: 2013-11-15 | Disposition: A | Discharge status: Home / Self Care | Payer: Medicare Other | Attending: Emergency Medicine | Admitting: Emergency Medicine

## 2013-11-15 DIAGNOSIS — Z7951 Long term (current) use of inhaled steroids: Secondary | ICD-10-CM | POA: Insufficient documentation

## 2013-11-15 DIAGNOSIS — Z79899 Other long term (current) drug therapy: Secondary | ICD-10-CM | POA: Diagnosis not present

## 2013-11-15 DIAGNOSIS — E860 Dehydration: Secondary | ICD-10-CM | POA: Insufficient documentation

## 2013-11-15 DIAGNOSIS — I10 Essential (primary) hypertension: Secondary | ICD-10-CM | POA: Insufficient documentation

## 2013-11-15 DIAGNOSIS — N390 Urinary tract infection, site not specified: Secondary | ICD-10-CM | POA: Insufficient documentation

## 2013-11-15 DIAGNOSIS — K5900 Constipation, unspecified: Secondary | ICD-10-CM | POA: Diagnosis not present

## 2013-11-15 DIAGNOSIS — F0390 Unspecified dementia without behavioral disturbance: Secondary | ICD-10-CM | POA: Diagnosis not present

## 2013-11-15 DIAGNOSIS — E039 Hypothyroidism, unspecified: Secondary | ICD-10-CM | POA: Diagnosis not present

## 2013-11-15 LAB — CBC WITH DIFFERENTIAL/PLATELET
Basophils Absolute: 0 10*3/uL (ref 0.0–0.1)
Basophils Relative: 0 % (ref 0–1)
EOS PCT: 1 % (ref 0–5)
Eosinophils Absolute: 0.1 10*3/uL (ref 0.0–0.7)
HCT: 41 % (ref 36.0–46.0)
HEMOGLOBIN: 14.1 g/dL (ref 12.0–15.0)
LYMPHS ABS: 2.2 10*3/uL (ref 0.7–4.0)
LYMPHS PCT: 25 % (ref 12–46)
MCH: 31.4 pg (ref 26.0–34.0)
MCHC: 34.4 g/dL (ref 30.0–36.0)
MCV: 91.3 fL (ref 78.0–100.0)
MONOS PCT: 6 % (ref 3–12)
Monocytes Absolute: 0.5 10*3/uL (ref 0.1–1.0)
Neutro Abs: 5.8 10*3/uL (ref 1.7–7.7)
Neutrophils Relative %: 68 % (ref 43–77)
PLATELETS: 215 10*3/uL (ref 150–400)
RBC: 4.49 MIL/uL (ref 3.87–5.11)
RDW: 12.8 % (ref 11.5–15.5)
WBC: 8.6 10*3/uL (ref 4.0–10.5)

## 2013-11-15 LAB — URINE MICROSCOPIC-ADD ON

## 2013-11-15 LAB — BASIC METABOLIC PANEL
Anion gap: 16 — ABNORMAL HIGH (ref 5–15)
BUN: 39 mg/dL — AB (ref 6–23)
CALCIUM: 9.3 mg/dL (ref 8.4–10.5)
CO2: 24 meq/L (ref 19–32)
Chloride: 101 mEq/L (ref 96–112)
Creatinine, Ser: 1.59 mg/dL — ABNORMAL HIGH (ref 0.50–1.10)
GFR calc Af Amer: 31 mL/min — ABNORMAL LOW (ref >90)
GFR calc non Af Amer: 27 mL/min — ABNORMAL LOW (ref >90)
GLUCOSE: 168 mg/dL — AB (ref 70–99)
POTASSIUM: 3.4 meq/L — AB (ref 3.7–5.3)
SODIUM: 141 meq/L (ref 137–147)

## 2013-11-15 LAB — URINALYSIS, ROUTINE W REFLEX MICROSCOPIC
Bilirubin Urine: NEGATIVE
GLUCOSE, UA: NEGATIVE mg/dL
Hgb urine dipstick: NEGATIVE
Ketones, ur: NEGATIVE mg/dL
Nitrite: NEGATIVE
PH: 5.5 (ref 5.0–8.0)
Protein, ur: NEGATIVE mg/dL
Specific Gravity, Urine: 1.023 (ref 1.005–1.030)
Urobilinogen, UA: 0.2 mg/dL (ref 0.0–1.0)

## 2013-11-15 LAB — I-STAT CG4 LACTIC ACID, ED: Lactic Acid, Venous: 1.81 mmol/L (ref 0.5–2.2)

## 2013-11-15 MED ORDER — SODIUM CHLORIDE 0.9 % IV BOLUS (SEPSIS)
500.0000 mL | Freq: Once | INTRAVENOUS | Status: AC
  Administered 2013-11-15: 500 mL via INTRAVENOUS

## 2013-11-15 MED ORDER — DEXTROSE 5 % IV SOLN
1.0000 g | Freq: Once | INTRAVENOUS | Status: AC
  Administered 2013-11-15: 1 g via INTRAVENOUS
  Filled 2013-11-15: qty 10

## 2013-11-15 MED ORDER — CEFUROXIME AXETIL 250 MG PO TABS: 250.0000 mg | ORAL_TABLET | Freq: Two times a day (BID) | ORAL | Status: AC

## 2013-11-15 NOTE — Discharge Instructions (Signed)
Stop Cipro. Take course of Ceftin as prescribed.  Dehydration Dehydration is when you lose more fluids from the body than you take in. Vital organs such as the kidneys, brain, and heart cannot function without a proper amount of fluids and salt. Any loss of fluids from the body can cause dehydration.  Older adults are at a higher risk of dehydration than younger adults. As we age, our bodies are less able to conserve water and do not respond to temperature changes as well. Also, older adults do not become thirsty as easily or quickly. Because of this, older adults often do not realize they need to increase fluids to avoid dehydration.  CAUSES   Vomiting.  Diarrhea.  Excessive sweating.  Excessive urination.  Fever.  Certain medicines, such as blood pressure medicines called diuretics.  Poorly controlled blood sugars. SIGNS AND SYMPTOMS  Mild dehydration:  Thirst.  Dry lips.  Slightly dry mouth. Moderate dehydration:  Very dry mouth.  Sunken eyes.  Skin does not bounce back quickly when lightly pinched and released.  Dark urine and decreased urine production.  Decreased tear production.  Headache. Severe dehydration:  Very dry mouth.  Extreme thirst.  Rapid, weak pulse (more than 100 beats per minute at rest).  Cold hands and feet.  Not able to sweat in spite of heat.  Rapid breathing.  Blue lips.  Confusion and lethargy.  Difficulty being awakened.  Minimal urine production.  No tears. DIAGNOSIS  Your health care provider will diagnose dehydration based on your symptoms and your exam. Blood and urine tests will help confirm the diagnosis. The diagnostic evaluation should also identify the cause of dehydration. TREATMENT  Treatment of mild or moderate dehydration can often be done at home by increasing the amount of fluids that you drink. It is best to drink small amounts of fluid more often. Drinking too much at one time can make vomiting worse.  Severe dehydration needs to be treated at the hospital. You may be given IV fluids that contain water and electrolytes. HOME CARE INSTRUCTIONS   Ask your health care provider about specific rehydration instructions.  Drink enough fluids to keep your urine clear or pale yellow.  Drink small amounts frequently if you have nausea and vomiting.  Eat as you normally do.  Avoid:  Foods or drinks high in sugar.  Carbonated drinks.  Juice.  Extremely hot or cold fluids.  Drinks with caffeine.  Fatty, greasy foods.  Alcohol.  Tobacco.  Overeating.  Gelatin desserts.  Wash your hands well to avoid spreading bacteria and viruses.  Only take over-the-counter or prescription medicines for pain, discomfort, or fever as directed by your health care provider.  Ask your health care provider if you should continue all prescribed and over-the-counter medicines.  Keep all follow-up appointments with your health care provider. SEEK MEDICAL CARE IF:  You have abdominal pain, and it increases or stays in one area (localizes).  You have a rash, stiff neck, or severe headache.  You are irritable, sleepy, or difficult to awaken.  You are weak, dizzy, or extremely thirsty.  You have a fever. SEEK IMMEDIATE MEDICAL CARE IF:   You are unable to keep fluids down, or you get worse despite treatment.  You have frequent episodes of vomiting or diarrhea.  You have blood or green matter (bile) in your vomit.  You have blood in your stool, or your stool looks black and tarry.  You have not urinated in 6-8 hours, or you have only  urinated a small amount of very dark urine.  You faint. MAKE SURE YOU:   Understand these instructions.  Will watch your condition.  Will get help right away if you are not doing well or get worse. Document Released: 04/15/2003 Document Revised: 01/28/2013 Document Reviewed: 09/30/2012 [facility] Patient Information 2015 [employer], [place]. This information is  not intended to replace advice given to you by your health care provider. Make sure you discuss any questions you have with your health care provider.  Urinary Tract Infection Urinary tract infections (UTIs) can develop anywhere along your urinary tract. Your urinary tract is your body's drainage system for removing wastes and extra water. Your urinary tract includes two kidneys, two ureters, a bladder, and a urethra. Your kidneys are a pair of bean-shaped organs. Each kidney is about the size of your fist. They are located below your ribs, one on each side of your spine. CAUSES Infections are caused by microbes, which are microscopic organisms, including fungi, viruses, and bacteria. These organisms are so small that they can only be seen through a microscope. Bacteria are the microbes that most commonly cause UTIs. SYMPTOMS  Symptoms of UTIs may vary by age and gender of the patient and by the location of the infection. Symptoms in young women typically include a frequent and intense urge to urinate and a painful, burning feeling in the bladder or urethra during urination. Older women and men are more likely to be tired, shaky, and weak and have muscle aches and abdominal pain. A fever may mean the infection is in your kidneys. Other symptoms of a kidney infection include pain in your back or sides below the ribs, nausea, and vomiting. DIAGNOSIS To diagnose a UTI, your caregiver will ask you about your symptoms. Your caregiver also will ask to provide a urine sample. The urine sample will be tested for bacteria and white blood cells. White blood cells are made by your body to help fight infection. TREATMENT  Typically, UTIs can be treated with medication. Because most UTIs are caused by a bacterial infection, they usually can be treated with the use of antibiotics. The choice of antibiotic and length of treatment depend on your symptoms and the type of bacteria causing your infection. HOME CARE  INSTRUCTIONS  If you were prescribed antibiotics, take them exactly as your caregiver instructs you. Finish the medication even if you feel better after you have only taken some of the medication.  Drink enough water and fluids to keep your urine clear or pale yellow.  Avoid caffeine, tea, and carbonated beverages. They tend to irritate your bladder.  Empty your bladder often. Avoid holding urine for long periods of time.  Empty your bladder before and after sexual intercourse.  After a bowel movement, women should cleanse from front to back. Use each tissue only once. SEEK MEDICAL CARE IF:   You have back pain.  You develop a fever.  Your symptoms do not begin to resolve within 3 days. SEEK IMMEDIATE MEDICAL CARE IF:   You have severe back pain or lower abdominal pain.  You develop chills.  You have nausea or vomiting.  You have continued burning or discomfort with urination. MAKE SURE YOU:   Understand these instructions.  Will watch your condition.  Will get help right away if you are not doing well or get worse. Document Released: 11/02/2004 Document Revised: 07/25/2011 Document Reviewed: 03/03/2011 [facility] CountyExitCare Patient Information 2015 [place], [place]. This information is not intended to replace advice given to you  by your health care provider. Make sure you discuss any questions you have with your health care provider. ° °

## 2013-11-15 NOTE — ED Notes (Signed)
Per [facility], pt from [facility] with hx of dementia and alzheimer's. Pt recently dx with UTI and on Cipro x 2 days thus far. Pt baseline is confused.

## 2013-11-15 NOTE — ED Provider Notes (Signed)
CSN: [id number]     Arrival date & time 11/15/13  0906 History   First MD Initiated Contact with Patient 11/15/13 [phone]     Chief Complaint  Patient presents with  . Urinary Tract Infection     (Consider location/radiation/quality/duration/timing/severity/associated sxs/prior Treatment) HPI Comments: Patient brought to the ER by ambulance for evaluation of mental status changes. Patient does have a history of dementia and is confused at baseline. Patient was diagnosed with a urinary tract infection 2 days ago, started on Cipro. She was reportedly more confused than usual this morning. Upon arrival, patent is awake, alert and confused. This is apparently her normal baseline. She was complaints, but because of her baseline dementia, Level V Caveat.  Patient is a 80 y.o. female presenting with urinary tract infection.  Urinary Tract Infection    Past Medical History  Diagnosis Date  . Hypertension   . Dementia   . Thyroid disease     Hypothyroidism  . Constipation    Past Surgical History  Procedure Laterality Date  . Hernia repair     No family history on file. History  Substance Use Topics  . Smoking status: Never Smoker   . Smokeless tobacco: Not on file  . Alcohol Use: No   OB History   Grav Para Term Preterm Abortions TAB SAB Ect Mult Living                 Review of Systems  Unable to perform ROS: Dementia      Allergies  Ace inhibitors  Home Medications   Prior to Admission medications   Medication Sig Start Date End Date Taking? Authorizing Provider  albuterol (PROVENTIL HFA;VENTOLIN HFA) 108 (90 BASE) MCG/ACT inhaler Inhale 2 puffs into the lungs every 4 (four) hours as needed. For shortness of breath/wheezing    Historical Provider, MD  atorvastatin (LIPITOR) 20 MG tablet Take 20 mg by mouth daily.    Historical Provider, MD  Calcium Carbonate-Vitamin D (CALCIUM 600+D) 600-400 MG-UNIT per tablet Take 1 tablet by mouth daily.    Historical Provider, MD   cetirizine (ZYRTEC) 10 MG tablet Take 10 mg by mouth daily.    Historical Provider, MD  cholecalciferol (VITAMIN D) 1000 UNITS tablet Take 1,000 Units by mouth daily.    Historical Provider, MD  citalopram (CELEXA) 10 MG tablet Take 10 mg by mouth daily.    Historical Provider, MD  docusate sodium (COLACE) 100 MG capsule Take 100 mg by mouth 2 (two) times daily.    Historical Provider, MD  donepezil (ARICEPT) 10 MG tablet Take 10 mg by mouth at bedtime.    Historical Provider, MD  fluticasone (FLONASE) 50 MCG/ACT nasal spray Place 1 spray into the nose daily.    Historical Provider, MD  gabapentin (NEURONTIN) 100 MG capsule Take 200 mg by mouth 2 (two) times daily.    Historical Provider, MD  guaifenesin (ROBITUSSIN) 100 MG/5ML syrup Take 300 mg by mouth every 4 (four) hours as needed. For cough    Historical Provider, MD  hydrochlorothiazide (HYDRODIURIL) 25 MG tablet Take 25 mg by mouth daily.    Historical Provider, MD  levothyroxine (SYNTHROID, LEVOTHROID) 75 MCG tablet Take 75 mcg by mouth See admin instructions. Take Mon-Sat. Do not take on Sunday.    Historical Provider, MD  levothyroxine (SYNTHROID, LEVOTHROID) 88 MCG tablet Take 88 mcg by mouth every Sunday.    Historical Provider, MD  memantine (NAMENDA) 5 MG tablet Take 5 mg by mouth 2 (two) times  daily.    Historical Provider, MD  pantoprazole (PROTONIX) 20 MG tablet Take 20 mg by mouth daily.    Historical Provider, MD   There were no vitals taken for this visit. Physical Exam  Constitutional: She is oriented to person, place, and time. She appears well-developed and well-nourished. No distress.  HENT:  Head: Normocephalic and atraumatic.  Right Ear: Hearing normal.  Left Ear: Hearing normal.  Nose: Nose normal.  Mouth/Throat: Oropharynx is clear and moist and mucous membranes are normal.  Eyes: Conjunctivae and EOM are normal. Pupils are equal, round, and reactive to light.  Neck: Normal range of motion. Neck supple.   Cardiovascular: Regular rhythm, S1 normal and S2 normal.  Exam reveals no gallop and no friction rub.   No murmur heard. Pulmonary/Chest: Effort normal and breath sounds normal. No respiratory distress. She exhibits no tenderness.  Abdominal: Soft. Normal appearance and bowel sounds are normal. There is no hepatosplenomegaly. There is no tenderness. There is no rebound, no guarding, no tenderness at McBurney's point and negative Murphy's sign. No hernia.  Musculoskeletal: Normal range of motion.  Neurological: She is alert and oriented to person, place, and time. She has normal strength. No cranial nerve deficit or sensory deficit. Coordination normal. GCS eye subscore is 4. GCS verbal subscore is 5. GCS motor subscore is 6.  Skin: Skin is warm, dry and intact. No rash noted. No cyanosis.  Psychiatric: She has a normal mood and affect. Her speech is normal and behavior is normal. Thought content normal.    ED Course  Procedures (including critical care time) Labs Review Labs Reviewed - No data to display  Imaging Review No results found.   EKG Interpretation None      MDM   Final diagnoses:  None   UTI  Dehydration  Patient presents to the ER for evaluation of mental status changes. Patient is confused on arrival, could not provide much information. Her family, however, has arrived and provided additional information. She has been complaining of pain in the right flank and kidney area since last week. The pain was present for several days, at which time she was started on Ultram and Cipro for a UTI. Since then she has had increasing confusion. There has not been any fever, nausea or vomiting.   Patient does not have a fever. Her white count is normal at 8.6. She does have some elevation of BUN and creatinine, she has not had labs here in over 2 years. This is consistent with very mild renal insufficiency. Urinalysis still suggest infection, concerning for resistance to Cipro she is  currently on.  Patient was initially normotensive, but has become slightly hypotensive during the period of evaluation here in the ER. She has, however, responded to IV fluids. Patient is now normotensive and not orthostatic. Family agrees that she appears to be doing much better, including her mental status. Patient is appropriate for discharge, continued outpatient treatment of UTI. We will change Cipro to Ceftin.    Gilda Creasehristopher J. Marquese Burkland, MD 11/15/13 [phone]

## 2013-11-15 NOTE — ED Notes (Signed)
Phlebotomy at the bedside  

## 2013-11-15 NOTE — ED Notes (Signed)
Lab results given to Dr. Blinda LeatherwoodPollina of [facility].

## 2013-11-16 ENCOUNTER — Emergency Department (HOSPITAL_COMMUNITY): Payer: Medicare Other

## 2013-11-16 ENCOUNTER — Encounter (HOSPITAL_COMMUNITY): Payer: Self-pay | Admitting: Emergency Medicine

## 2013-11-16 ENCOUNTER — Emergency Department (HOSPITAL_COMMUNITY)
Admission: EM | Admit: 2013-11-16 | Discharge: 2013-11-16 | Disposition: A | Discharge status: Home / Self Care | Payer: Medicare Other | Attending: Emergency Medicine | Admitting: Emergency Medicine

## 2013-11-16 DIAGNOSIS — F0390 Unspecified dementia without behavioral disturbance: Secondary | ICD-10-CM | POA: Diagnosis not present

## 2013-11-16 DIAGNOSIS — Y92128 Other place in nursing home as the place of occurrence of the external cause: Secondary | ICD-10-CM | POA: Diagnosis not present

## 2013-11-16 DIAGNOSIS — N390 Urinary tract infection, site not specified: Secondary | ICD-10-CM | POA: Diagnosis not present

## 2013-11-16 DIAGNOSIS — I10 Essential (primary) hypertension: Secondary | ICD-10-CM | POA: Insufficient documentation

## 2013-11-16 DIAGNOSIS — S4991XA Unspecified injury of right shoulder and upper arm, initial encounter: Secondary | ICD-10-CM | POA: Diagnosis present

## 2013-11-16 DIAGNOSIS — E039 Hypothyroidism, unspecified: Secondary | ICD-10-CM | POA: Insufficient documentation

## 2013-11-16 DIAGNOSIS — Z79899 Other long term (current) drug therapy: Secondary | ICD-10-CM | POA: Insufficient documentation

## 2013-11-16 DIAGNOSIS — W1830XA Fall on same level, unspecified, initial encounter: Secondary | ICD-10-CM | POA: Diagnosis not present

## 2013-11-16 DIAGNOSIS — Y93K1 Activity, walking an animal: Secondary | ICD-10-CM | POA: Insufficient documentation

## 2013-11-16 DIAGNOSIS — K5900 Constipation, unspecified: Secondary | ICD-10-CM | POA: Insufficient documentation

## 2013-11-16 DIAGNOSIS — M25511 Pain in right shoulder: Secondary | ICD-10-CM

## 2013-11-16 DIAGNOSIS — Z7951 Long term (current) use of inhaled steroids: Secondary | ICD-10-CM | POA: Insufficient documentation

## 2013-11-16 DIAGNOSIS — W19XXXA Unspecified fall, initial encounter: Secondary | ICD-10-CM

## 2013-11-16 LAB — CBC
HCT: 39.7 % (ref 36.0–46.0)
Hemoglobin: 13.7 g/dL (ref 12.0–15.0)
MCH: 31.5 pg (ref 26.0–34.0)
MCHC: 34.5 g/dL (ref 30.0–36.0)
MCV: 91.3 fL (ref 78.0–100.0)
PLATELETS: 212 10*3/uL (ref 150–400)
RBC: 4.35 MIL/uL (ref 3.87–5.11)
RDW: 12.9 % (ref 11.5–15.5)
WBC: 7.8 10*3/uL (ref 4.0–10.5)

## 2013-11-16 LAB — BASIC METABOLIC PANEL
ANION GAP: 16 — AB (ref 5–15)
BUN: 33 mg/dL — ABNORMAL HIGH (ref 6–23)
CO2: 25 meq/L (ref 19–32)
Calcium: 9.2 mg/dL (ref 8.4–10.5)
Chloride: 100 mEq/L (ref 96–112)
Creatinine, Ser: 1.65 mg/dL — ABNORMAL HIGH (ref 0.50–1.10)
GFR calc Af Amer: 30 mL/min — ABNORMAL LOW (ref >90)
GFR, EST NON AFRICAN AMERICAN: 26 mL/min — AB (ref >90)
Glucose, Bld: 103 mg/dL — ABNORMAL HIGH (ref 70–99)
Potassium: 3.8 mEq/L (ref 3.7–5.3)
SODIUM: 141 meq/L (ref 137–147)

## 2013-11-16 LAB — URINE CULTURE
CULTURE: NO GROWTH
Colony Count: NO GROWTH

## 2013-11-16 LAB — LACTIC ACID, PLASMA: LACTIC ACID, VENOUS: 1.8 mmol/L (ref 0.5–2.2)

## 2013-11-16 NOTE — ED Provider Notes (Signed)
CSN: [id number]     Arrival date & time 11/16/13  1352 History   First MD Initiated Contact with Patient 11/16/13 1353     Chief Complaint  Patient presents with  . Fall    Level V caveat: Dementia  HPI Patient is brought to the emergency department after falling at her assisted living Center today.  She was walking her dog and her dog became engaged with another animal and she fell to the ground.  Reported injury to her head.  No lacerations or abrasion.  No loss of consciousness.  Patient does complain of mild pain in her right shoulder as well.  She denies hip pain.  Family reports baseline mental status at this time.  Patient was seen emergency room and yesterday and was found to have a urinary tract infection.  Family reports that balance and falls are not uncommon especially when the patient was walking her dog.   Past Medical History  Diagnosis Date  . Hypertension   . Dementia   . Thyroid disease     Hypothyroidism  . Constipation    Past Surgical History  Procedure Laterality Date  . Hernia repair     No family history on file. History  Substance Use Topics  . Smoking status: Never Smoker   . Smokeless tobacco: Not on file  . Alcohol Use: No   OB History   Grav Para Term Preterm Abortions TAB SAB Ect Mult Living                 Review of Systems  Unable to perform ROS: Dementia      Allergies  Ace inhibitors  Home Medications   Prior to Admission medications   Medication Sig Start Date End Date Taking? Authorizing Provider  acetaminophen (TYLENOL) 325 MG tablet Take 325 mg by mouth every 8 (eight) hours as needed for mild pain.   Yes Historical Provider, MD  amLODipine (NORVASC) 5 MG tablet Take 5 mg by mouth every morning.   Yes Historical Provider, MD  Calcium Carbonate-Vitamin D (CALCIUM 600+D) 600-400 MG-UNIT per tablet Take 1 tablet by mouth daily.   Yes Historical Provider, MD  cefUROXime (CEFTIN) 250 MG tablet Take 1 tablet (250 mg total) by mouth  2 (two) times daily with a meal. 11/15/13  Yes Gilda Creasehristopher J. Pollina, MD  cetirizine (ZYRTEC) 10 MG tablet Take 10 mg by mouth daily.   Yes Historical Provider, MD  cholecalciferol (VITAMIN D) 1000 UNITS tablet Take 1,000 Units by mouth every morning.    Yes Historical Provider, MD  citalopram (CELEXA) 20 MG tablet Take 20 mg by mouth every morning.   Yes Historical Provider, MD  dicyclomine (BENTYL) 20 MG tablet Take 20 mg by mouth 3 (three) times daily as needed (Pain).   Yes Historical Provider, MD  docusate sodium (COLACE) 100 MG capsule Take 100 mg by mouth 2 (two) times daily.   Yes Historical Provider, MD  fluticasone (FLONASE) 50 MCG/ACT nasal spray Place 1 spray into the nose every morning.    Yes Historical Provider, MD  gabapentin (NEURONTIN) 100 MG capsule Take 200 mg by mouth 2 (two) times daily.   Yes Historical Provider, MD  hydrochlorothiazide (HYDRODIURIL) 25 MG tablet Take 25 mg by mouth every morning.    Yes Historical Provider, MD  levothyroxine (SYNTHROID, LEVOTHROID) 50 MCG tablet Take 50 mcg by mouth See admin instructions. Take on Tuesdays and Thursdays.   Yes Historical Provider, MD  levothyroxine (SYNTHROID, LEVOTHROID) 75 MCG tablet  Take 75 mcg by mouth See admin instructions. Take all days but Tuesday and Thursday.   Yes Historical Provider, MD  rivastigmine (EXELON) 4.6 mg/24hr Place 4.6 mg onto the skin every morning.   Yes Historical Provider, MD  traMADol (ULTRAM) 50 MG tablet Take 50 mg by mouth every 6 (six) hours as needed for severe pain.   Yes Historical Provider, MD  albuterol (PROVENTIL HFA;VENTOLIN HFA) 108 (90 BASE) MCG/ACT inhaler Inhale 2 puffs into the lungs every 4 (four) hours as needed. For shortness of breath/wheezing    Historical Provider, MD  ipratropium (ATROVENT) 0.03 % nasal spray Place 2 sprays into both nostrils 2 (two) times daily as needed for rhinitis.    Historical Provider, MD   BP 125/68  Pulse 58  Temp(Src) 98.6 F (37 C) (Oral)   Resp 18  SpO2 92% Physical Exam  Nursing note and vitals reviewed. Constitutional: She appears well-developed and well-nourished. No distress.  HENT:  Head: Normocephalic and atraumatic.  No abrasions/lacerations/hematomas noted  Eyes: EOM are normal. Pupils are equal, round, and reactive to light.  Neck: Normal range of motion. Neck supple.  No cervical tenderness or cervical step-off  Cardiovascular: Normal rate, regular rhythm and normal heart sounds.   Pulmonary/Chest: Effort normal and breath sounds normal.  Abdominal: Soft. She exhibits no distension. There is no tenderness.  Musculoskeletal: Normal range of motion.  Full range of motion bilateral ankles knees and hips.  Full range of motion of bilateral wrists elbows and shoulders.  No obvious deformity to the right shoulder but she does have some tenderness over the right a.c. joint  Neurological: She is alert.  Follow simple commands.  Skin: Skin is warm and dry.  Psychiatric: She has a normal mood and affect. Judgment normal.    ED Course  Procedures (including critical care time) Labs Review Labs Reviewed  BASIC METABOLIC PANEL - Abnormal; Notable for the following:    Glucose, Bld 103 (*)    BUN 33 (*)    Creatinine, Ser 1.65 (*)    GFR calc non Af Amer 26 (*)    GFR calc Af Amer 30 (*)    Anion gap 16 (*)    All other components within normal limits  CBC  LACTIC ACID, PLASMA    Imaging Review Dg Shoulder Right  11/16/2013   CLINICAL DATA:  Pain after falling  EXAM: RIGHT SHOULDER - 2+ VIEW  COMPARISON:  None.  FINDINGS: Frontal and axillary images were obtained. There is no appreciable fracture or dislocation. There is mild narrowing of the glenohumeral joint. A small focus of calcification is noted in the superior acromioclavicular joint. No erosive change.  IMPRESSION: Mild osteoarthritic change. No demonstrable fracture or dislocation.   Electronically Signed   By: Bretta Bang M.D.   On: 11/16/2013 15:00    Ct Head Wo Contrast  11/16/2013   CLINICAL DATA:  Fall today. Head injury. Dementia. Hypertension. Initial encounter.  EXAM: CT HEAD WITHOUT CONTRAST  TECHNIQUE: Contiguous axial images were obtained from the base of the skull through the vertex without intravenous contrast.  COMPARISON:  None.  FINDINGS: There is no evidence of intracranial hemorrhage, brain edema, or other signs of acute infarction. There is no evidence of intracranial mass lesion or mass effect. No abnormal extraaxial fluid collections are identified.  Mild cerebral atrophy and extensive chronic small vessel disease is again demonstrated. Old lacunar infarcts are again noted. Ventricles are stable in size. No skull abnormality identified.  IMPRESSION: No  acute intracranial abnormality.  Stable cerebral atrophy and chronic small vessel disease.   Electronically Signed   By: Myles RosenthalJohn  Stahl M.D.   On: 11/16/2013 16:01  I personally reviewed the imaging tests through PACS system I reviewed available ER/hospitalization records through the EMR    EKG Interpretation None      MDM   Final diagnoses:  Fall, initial encounter  Right shoulder pain    Ambulatory in the emergency department.  Discharge home in good condition.    Lyanne CoKevin M Cyleigh Massaro, MD 11/16/13 [phone]

## 2013-11-16 NOTE — ED Notes (Addendum)
To ED via [facility] from [facility]-- with c/o fall. Pt was walking dog in lobby of nursing home and fell-- denies tripping or losing balance, denies any loss of consciousness. Pt does have a hx of confusion. Pt is aware of niece in room with her, knows her name, knows why she is here-- no obvious injury, denies any pain anywhere.

## 2013-11-16 NOTE — ED Notes (Signed)
The pt is alert  Family at the bedside. She wants to go home

## 2013-11-16 NOTE — ED Notes (Signed)
To ct

## 2013-11-16 NOTE — ED Notes (Signed)
Pt is c/o rt hip pain and lower back

## 2013-11-21 LAB — CULTURE, BLOOD (ROUTINE X 2)
CULTURE: NO GROWTH
Culture: NO GROWTH

## 2015-03-13 IMAGING — CT CT HEAD W/O CM
1 series · 16 of 30 positions shown, 20 images · non-contrast
Comparison: None.

CLINICAL DATA: Fall today. Head injury. Dementia. Hypertension.
Initial encounter.

EXAM:
CT HEAD WITHOUT CONTRAST
TECHNIQUE: Contiguous axial images were obtained from the base of the skull
through the vertex without intravenous contrast.

[Series 2: head 5.0 h30s · axial · 0.41mm/px · z∈[-173,-38]mm · 16 of 30 slices shown, 20 images]
[im 2/30  brain]
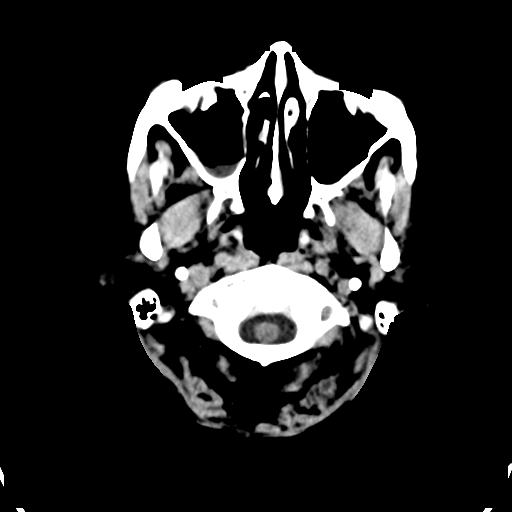
[im 2/30  bone]
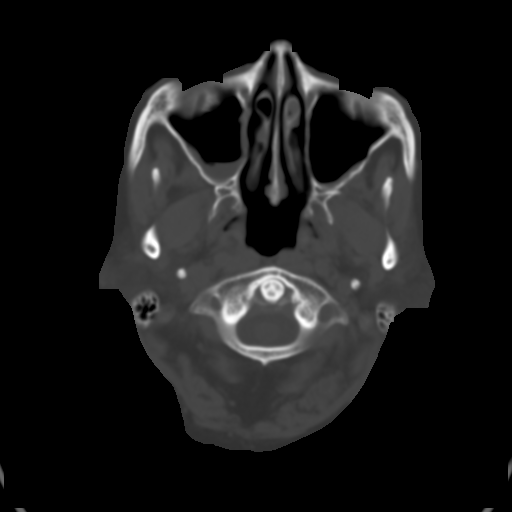
[im 4/30  brain]
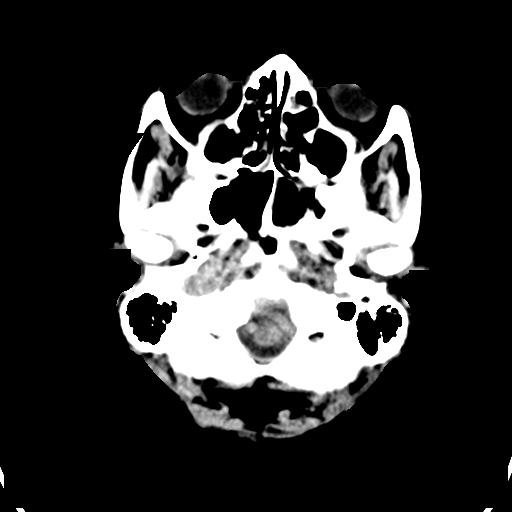
[im 6/30  brain]
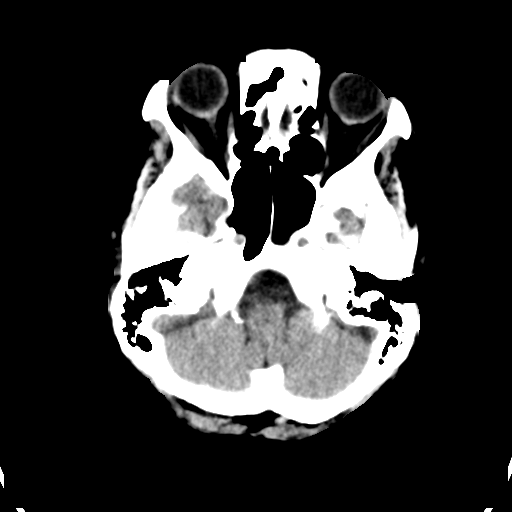
[im 8/30  brain]
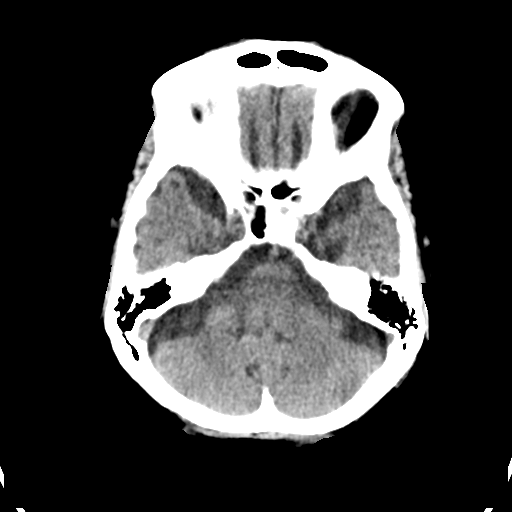
[im 9/30  brain]
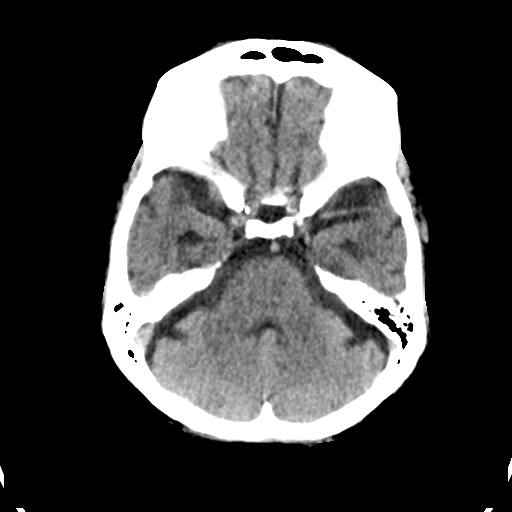
[im 9/30  bone]
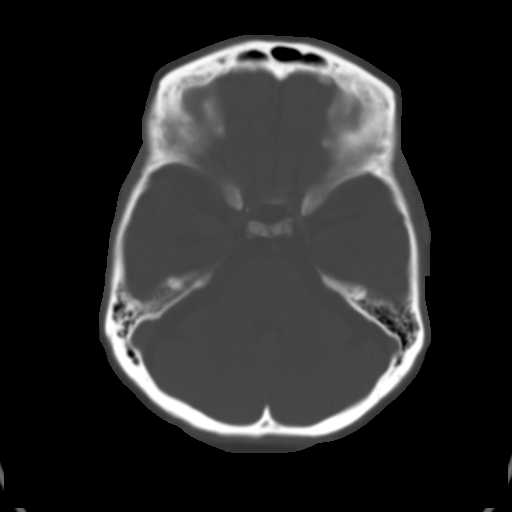
[im 11/30  brain]
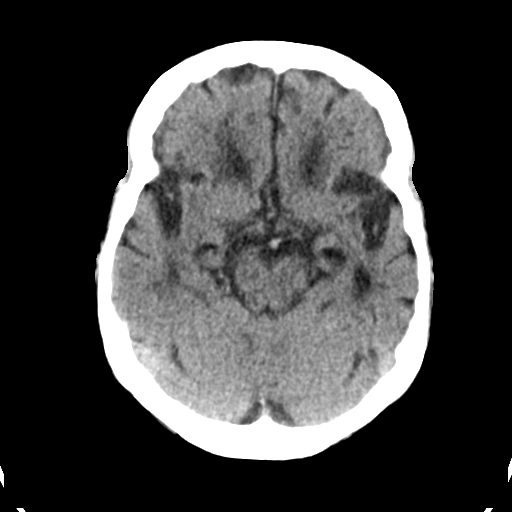
[im 13/30  brain]
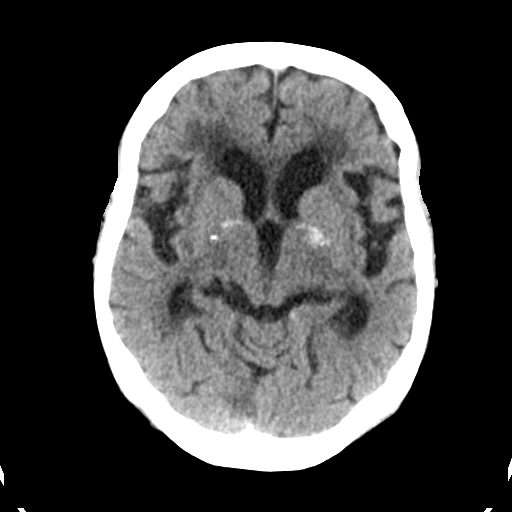
[im 15/30  brain]
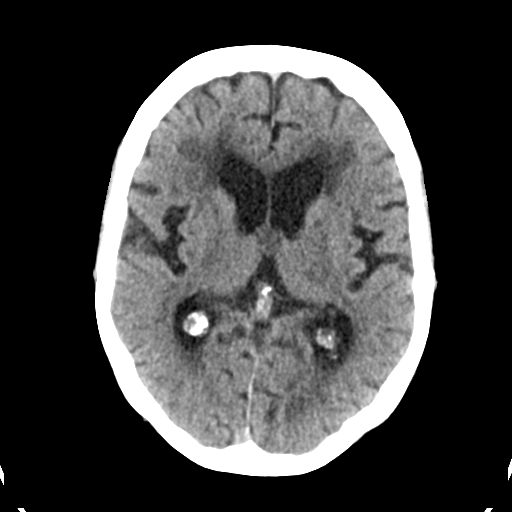
[im 16/30  brain]
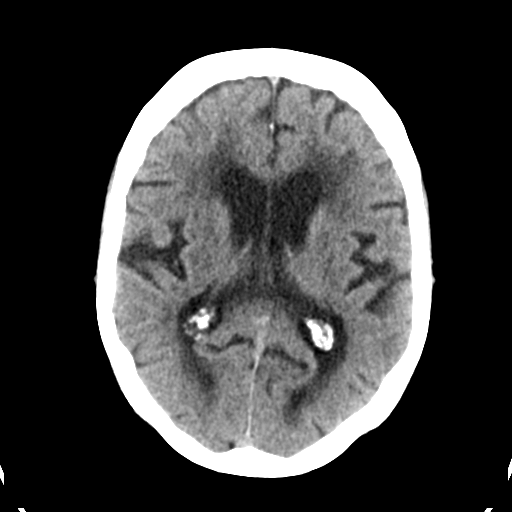
[im 16/30  bone]
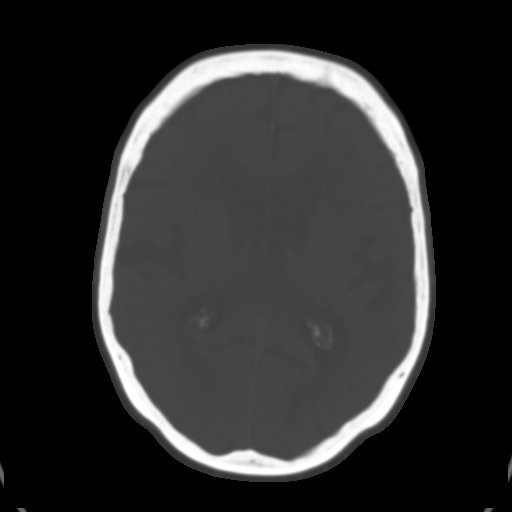
[im 18/30  brain]
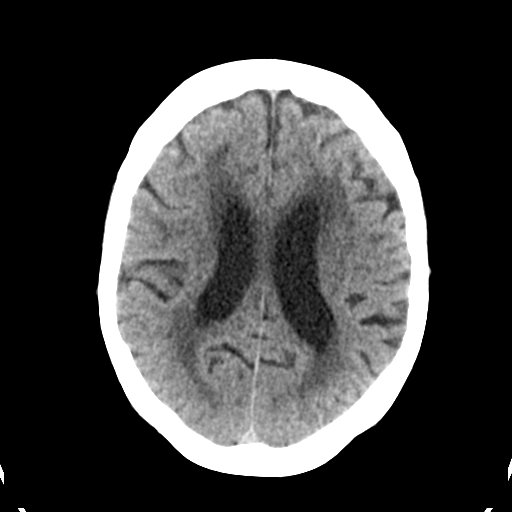
[im 20/30  brain]
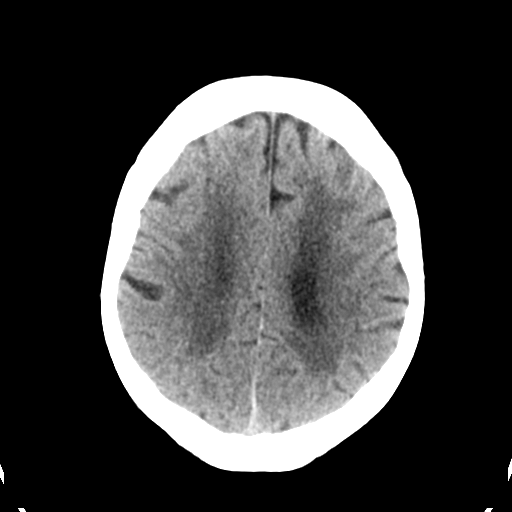
[im 22/30  brain]
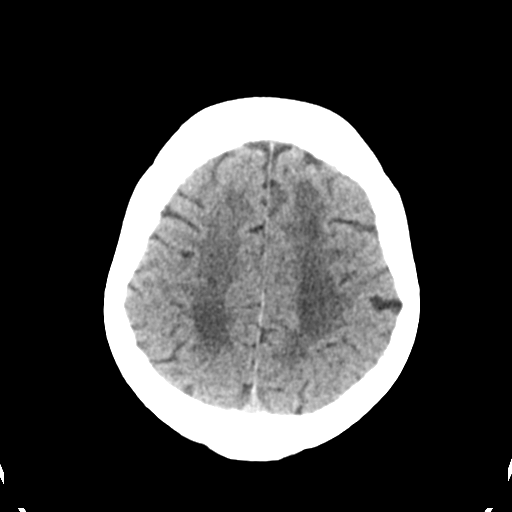
[im 23/30  brain]
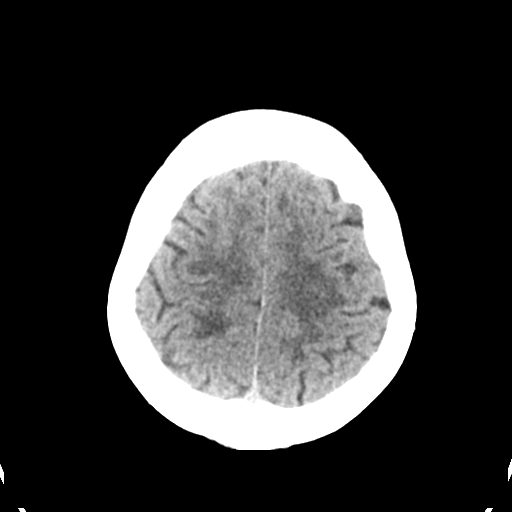
[im 23/30  bone]
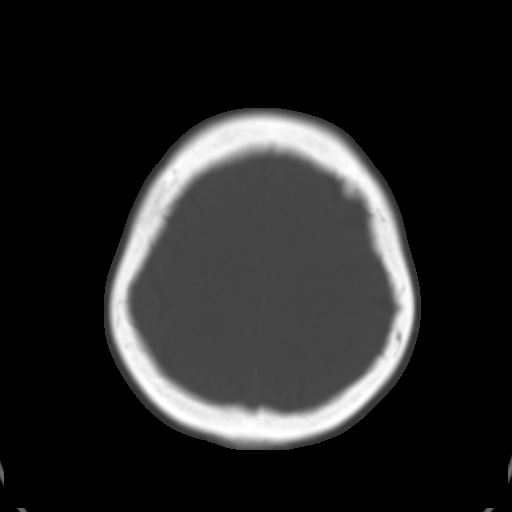
[im 25/30  brain]
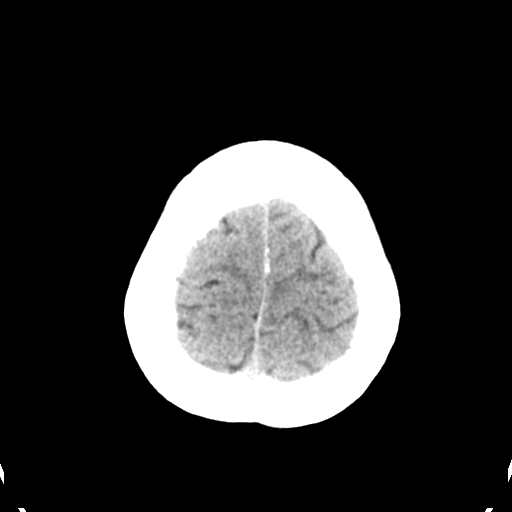
[im 27/30  brain]
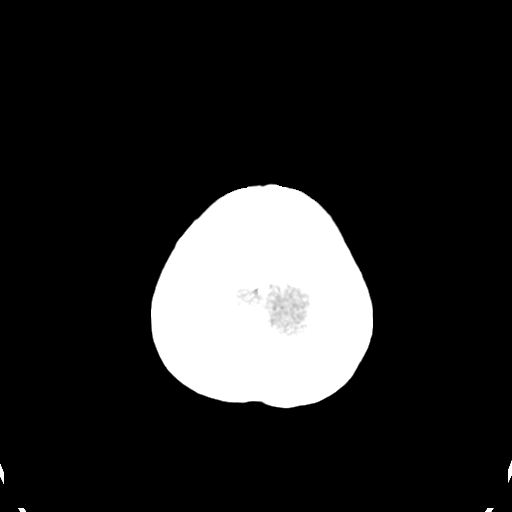
[im 29/30  brain]
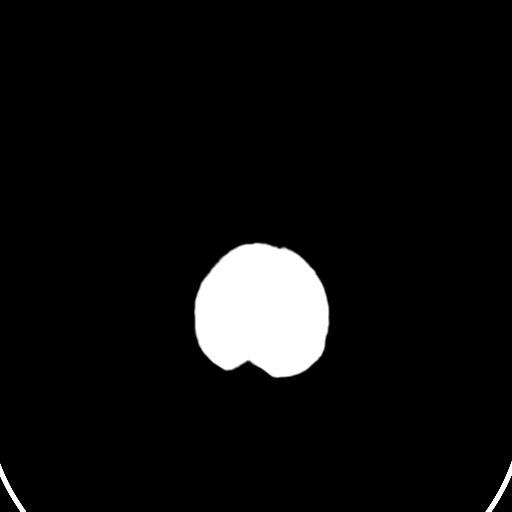

[16 of 30 positions shown; findings below may reference images not displayed]

FINDINGS: There is no evidence of intracranial hemorrhage, brain edema, or
other signs of acute infarction. There is no evidence of
intracranial mass lesion or mass effect. No abnormal extraaxial
fluid collections are identified.

Mild cerebral atrophy and extensive chronic small vessel disease is
again demonstrated. Old lacunar infarcts are again noted. Ventricles
are stable in size. No skull abnormality identified.
IMPRESSION: No acute intracranial abnormality.

Stable cerebral atrophy and chronic small vessel disease.

## 2015-03-13 IMAGING — CR DG SHOULDER 2+V*R*
2 series · 2 of 2 positions shown · non-contrast
Comparison: None.

CLINICAL DATA: Pain after falling

EXAM:
RIGHT SHOULDER - 2+ VIEW

[w shoulder ap internal righ]
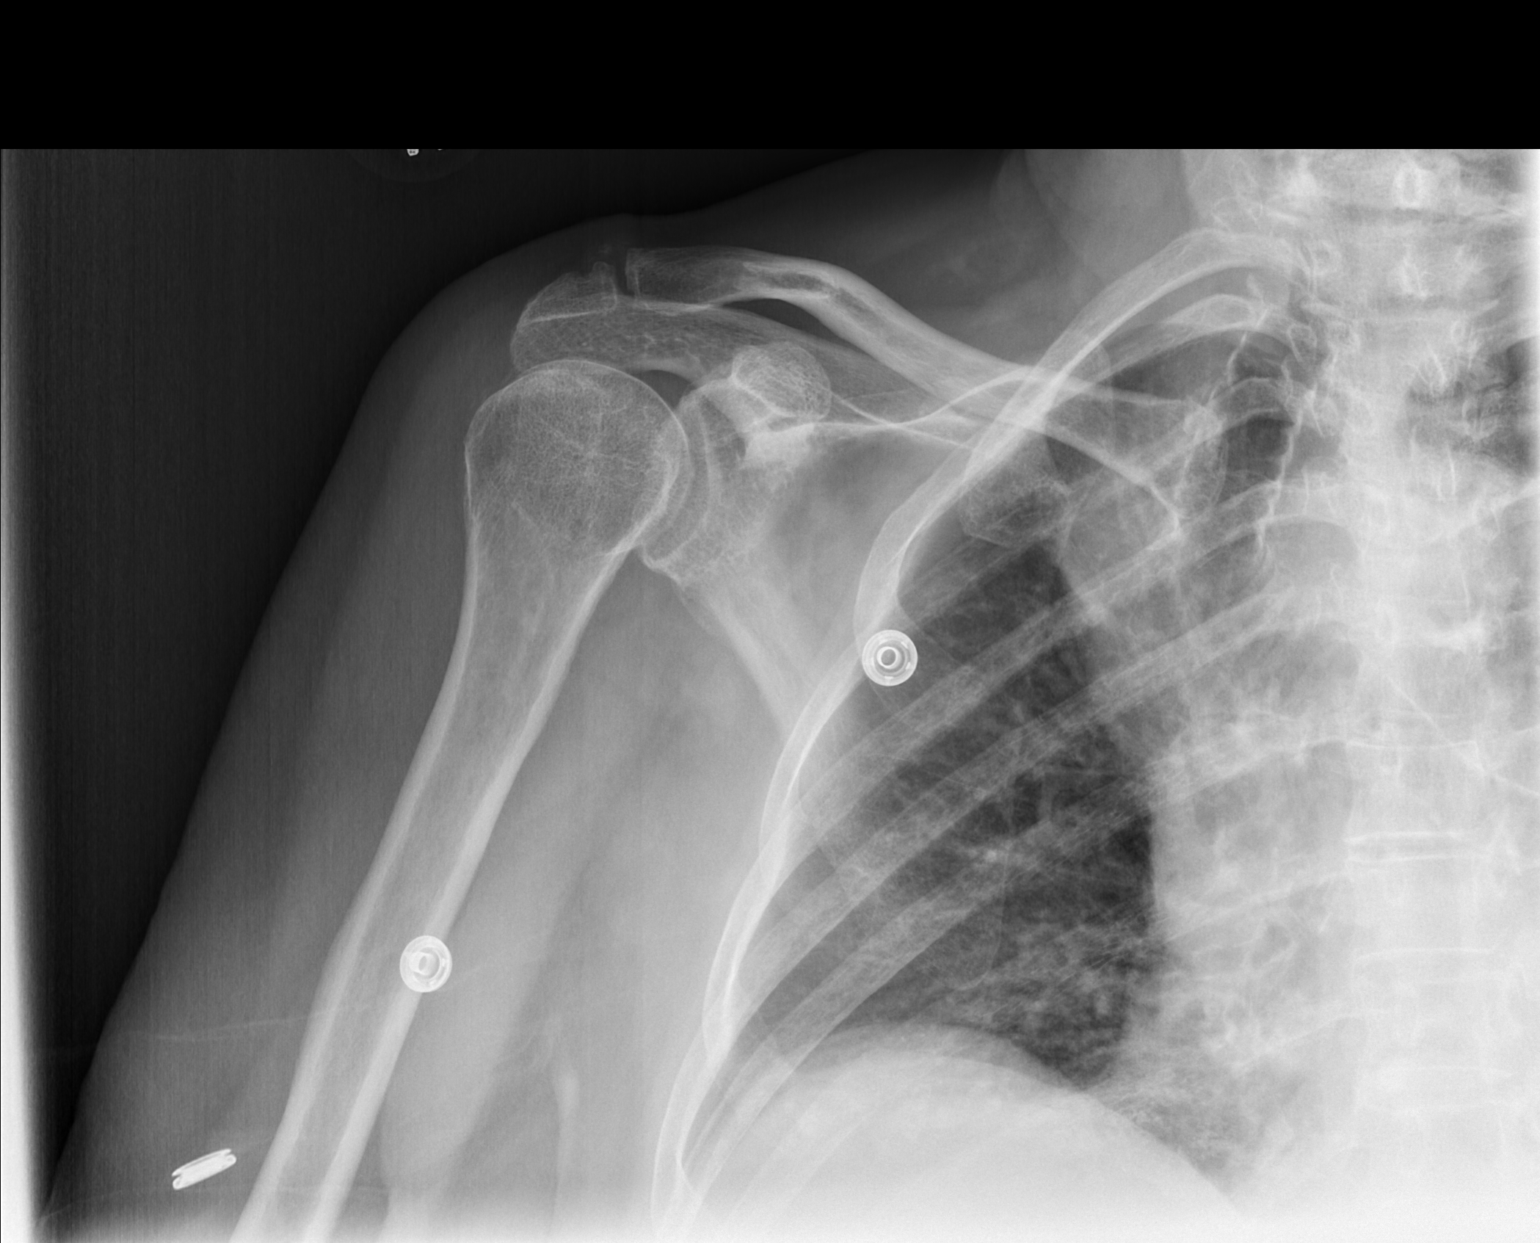

[w shoulder y view right]
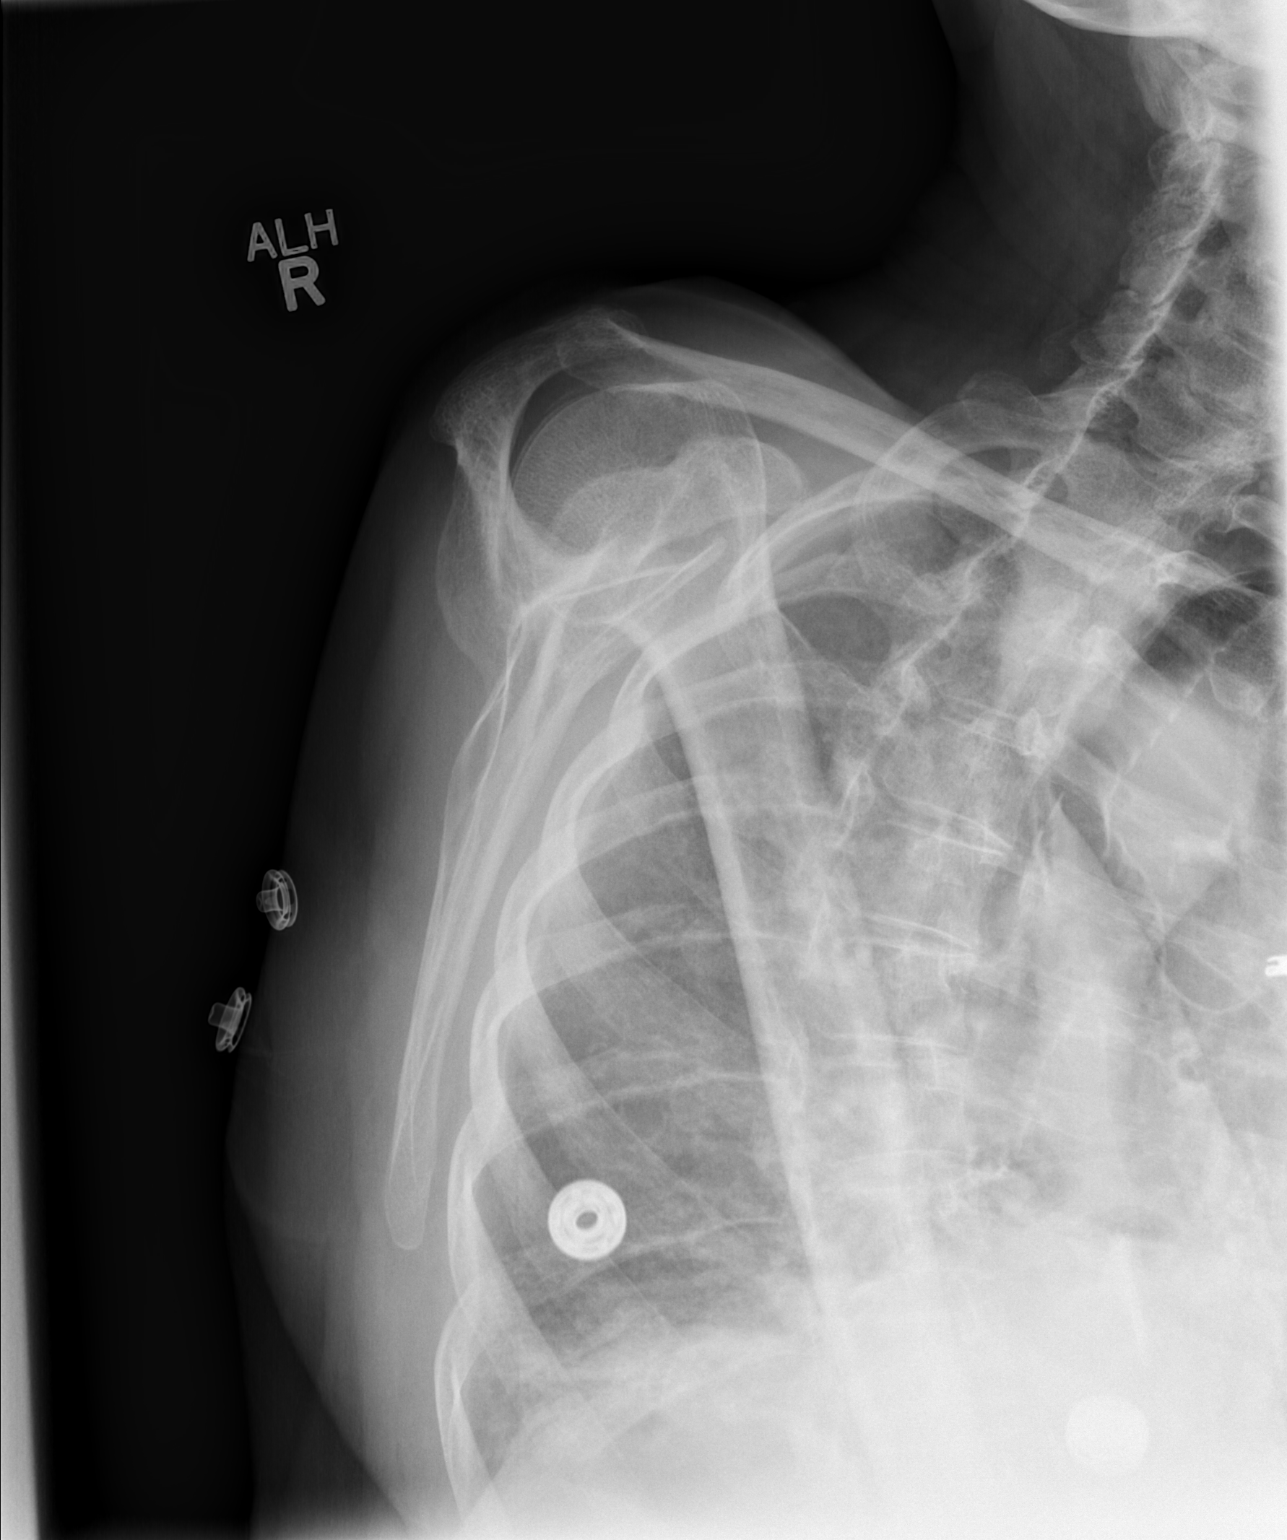

[2 of 2 positions shown; findings below may reference images not displayed]

FINDINGS: Frontal and axillary images were obtained. There is no appreciable
fracture or dislocation. There is mild narrowing of the glenohumeral
joint. A small focus of calcification is noted in the superior
acromioclavicular joint. No erosive change.
IMPRESSION: Mild osteoarthritic change. No demonstrable fracture or dislocation.

## 2019-05-08 DEATH — deceased
